# Patient Record
Sex: Female | Born: 1935 | Race: White | Hispanic: No | Marital: Married | Smoking: Never smoker
Health system: Southern US, Community
[De-identification: ages and names within clinical notes are randomized; demographics above are authoritative.]

## PROBLEM LIST (undated history)

## (undated) DIAGNOSIS — F32A Depression, unspecified: Secondary | ICD-10-CM

## (undated) DIAGNOSIS — F419 Anxiety disorder, unspecified: Secondary | ICD-10-CM

## (undated) DIAGNOSIS — F0390 Unspecified dementia without behavioral disturbance: Secondary | ICD-10-CM

## (undated) DIAGNOSIS — F329 Major depressive disorder, single episode, unspecified: Secondary | ICD-10-CM

## (undated) HISTORY — PX: FRACTURE SURGERY: SHX138

---

## 1998-10-22 ENCOUNTER — Ambulatory Visit (HOSPITAL_COMMUNITY): Admission: RE | Admit: 1998-10-22 | Discharge: 1998-10-22 | Payer: Self-pay | Admitting: Specialist

## 2000-03-02 ENCOUNTER — Other Ambulatory Visit: Admission: RE | Admit: 2000-03-02 | Discharge: 2000-03-02 | Payer: Self-pay | Admitting: Gastroenterology

## 2000-03-30 ENCOUNTER — Encounter: Admission: RE | Admit: 2000-03-30 | Discharge: 2000-03-30 | Payer: Self-pay

## 2000-03-30 ENCOUNTER — Encounter: Payer: Self-pay | Admitting: Gastroenterology

## 2000-11-02 ENCOUNTER — Inpatient Hospital Stay (HOSPITAL_COMMUNITY): Admission: EM | Admit: 2000-11-02 | Discharge: 2000-11-04 | Payer: Self-pay | Admitting: *Deleted

## 2001-03-29 ENCOUNTER — Other Ambulatory Visit: Admission: RE | Admit: 2001-03-29 | Discharge: 2001-03-29 | Payer: Self-pay | Admitting: Gastroenterology

## 2001-04-05 ENCOUNTER — Encounter: Admission: RE | Admit: 2001-04-05 | Discharge: 2001-04-05 | Payer: Self-pay | Admitting: Gastroenterology

## 2001-04-05 ENCOUNTER — Encounter: Payer: Self-pay | Admitting: Gastroenterology

## 2001-04-12 ENCOUNTER — Encounter: Admission: RE | Admit: 2001-04-12 | Discharge: 2001-04-12 | Payer: Self-pay | Admitting: Gastroenterology

## 2001-04-12 ENCOUNTER — Encounter: Payer: Self-pay | Admitting: Gastroenterology

## 2001-05-31 ENCOUNTER — Ambulatory Visit (HOSPITAL_COMMUNITY): Admission: RE | Admit: 2001-05-31 | Discharge: 2001-05-31 | Payer: Self-pay | Admitting: Specialist

## 2002-04-06 ENCOUNTER — Other Ambulatory Visit: Admission: RE | Admit: 2002-04-06 | Discharge: 2002-04-06 | Payer: Self-pay | Admitting: Gastroenterology

## 2002-04-26 ENCOUNTER — Encounter: Admission: RE | Admit: 2002-04-26 | Discharge: 2002-04-26 | Payer: Self-pay | Admitting: Gastroenterology

## 2002-04-26 ENCOUNTER — Encounter: Payer: Self-pay | Admitting: Gastroenterology

## 2003-04-12 ENCOUNTER — Other Ambulatory Visit: Admission: RE | Admit: 2003-04-12 | Discharge: 2003-04-12 | Payer: Self-pay | Admitting: Gastroenterology

## 2003-04-28 ENCOUNTER — Encounter: Payer: Self-pay | Admitting: Gastroenterology

## 2003-04-28 ENCOUNTER — Encounter: Admission: RE | Admit: 2003-04-28 | Discharge: 2003-04-28 | Payer: Self-pay | Admitting: Gastroenterology

## 2003-08-08 ENCOUNTER — Ambulatory Visit (HOSPITAL_COMMUNITY): Admission: RE | Admit: 2003-08-08 | Discharge: 2003-08-08 | Payer: Self-pay | Admitting: Gastroenterology

## 2003-08-08 ENCOUNTER — Encounter (INDEPENDENT_AMBULATORY_CARE_PROVIDER_SITE_OTHER): Payer: Self-pay | Admitting: *Deleted

## 2004-04-29 ENCOUNTER — Encounter: Admission: RE | Admit: 2004-04-29 | Discharge: 2004-04-29 | Payer: Self-pay | Admitting: Gastroenterology

## 2005-05-07 ENCOUNTER — Other Ambulatory Visit: Admission: RE | Admit: 2005-05-07 | Discharge: 2005-05-07 | Payer: Self-pay | Admitting: Gastroenterology

## 2005-05-19 ENCOUNTER — Encounter: Admission: RE | Admit: 2005-05-19 | Discharge: 2005-05-19 | Payer: Self-pay | Admitting: Gastroenterology

## 2005-10-29 ENCOUNTER — Encounter: Admission: RE | Admit: 2005-10-29 | Discharge: 2005-10-29 | Payer: Self-pay | Admitting: Gastroenterology

## 2006-05-21 ENCOUNTER — Encounter: Admission: RE | Admit: 2006-05-21 | Discharge: 2006-05-21 | Payer: Self-pay | Admitting: Gastroenterology

## 2006-06-30 ENCOUNTER — Emergency Department (HOSPITAL_COMMUNITY): Admission: EM | Admit: 2006-06-30 | Discharge: 2006-06-30 | Payer: Self-pay | Admitting: Family Medicine

## 2006-07-10 ENCOUNTER — Emergency Department (HOSPITAL_COMMUNITY): Admission: EM | Admit: 2006-07-10 | Discharge: 2006-07-10 | Payer: Self-pay | Admitting: Family Medicine

## 2007-05-17 ENCOUNTER — Other Ambulatory Visit: Admission: RE | Admit: 2007-05-17 | Discharge: 2007-05-17 | Payer: Self-pay | Admitting: Gastroenterology

## 2007-05-24 ENCOUNTER — Encounter: Admission: RE | Admit: 2007-05-24 | Discharge: 2007-05-24 | Payer: Self-pay | Admitting: Gastroenterology

## 2008-05-29 ENCOUNTER — Encounter: Admission: RE | Admit: 2008-05-29 | Discharge: 2008-05-29 | Payer: Self-pay | Admitting: Gastroenterology

## 2008-08-18 ENCOUNTER — Emergency Department (HOSPITAL_COMMUNITY): Admission: EM | Admit: 2008-08-18 | Discharge: 2008-08-18 | Payer: Self-pay | Admitting: Family Medicine

## 2009-06-04 ENCOUNTER — Encounter: Admission: RE | Admit: 2009-06-04 | Discharge: 2009-06-04 | Payer: Self-pay | Admitting: Gastroenterology

## 2009-08-25 ENCOUNTER — Encounter: Admission: RE | Admit: 2009-08-25 | Discharge: 2009-08-25 | Payer: Self-pay | Admitting: Neurology

## 2010-10-21 ENCOUNTER — Encounter: Admission: RE | Admit: 2010-10-21 | Discharge: 2010-10-21 | Payer: Self-pay | Admitting: Internal Medicine

## 2011-04-11 NOTE — Discharge Summary (Signed)
[place]  Patient:    Vickie Hoffman, Vickie Hoffman                    MRN: [id number] Adm. Date:  [id number] Disc. Date: 11/04/00 Attending:  Trisha Mangle                           Discharge Summary  PRINCIPAL DIAGNOSIS:  Hemorrhagic cystitis.  OTHER DIAGNOSIS:  Diabetes.  MAJOR OPERATION:  None.  CONDITION:  Patient is discharged home in stable, satisfactory and improved condition with clear urine and voiding without difficulty.  FOLLOWUP:  Her followup will be in my office in one week.  DISCHARGE MEDICATIONS:  Her medications will be unchanged from admission and she will continue on Levaquin until final culture results are obtained in my office later today and she will be contacted with these.  DIET:  Unchanged.  ACTIVITY:  Light, as tolerated.  BRIEF HISTORY:  Patient is a 77 year old white female who was seen in my office earlier the day of her admission with irritative voiding symptoms and some mild pyuria but no hematuria.  She was treated with intravesical DMSO, Solu-Cortef and bicarbonate and her urine was cultured.  She was started on empiric Levaquin but developed gross hematuria with clots and was admitted. Her full history and physical was dictated previously.  HOSPITAL COURSE:  Patient was admitted and Ainsworth catheter was placed in the bladder and a large volume of clots was evacuated from the bladder.  She was then started on intravenous fluid hydration and irrigation p.r.n. Laboratory results revealed mildly depressed sodium at 130.  Remainder of her electrolytes looked good with a normal creatinine.  Her coagulations were normal and she had a normal white count with an hemoglobin of 10 and hematocrit of 30.  Her hospital course consisted of the requirement of several irrigations throughout the 24 hours after admission but after that, her urine cleared completely and at the time of her discharge, her urine is entirely  clear and free of any clots or evidence of hematuria.  Her catheter has been removed and she will be discharged home on Levaquin, with followup and disposition as above. DD:  11/04/00 TD:  11/04/00 Job: [id number]

## 2011-04-11 NOTE — H&P (Signed)
[place]  Patient:    Vickie Hoffman, Vickie Hoffman                    MRN: [id number] Adm. Date:  [id number] Disc. Date: [id number] Attending:  Trisha Mangle CC:         Mark C. Vernie Ammons, M.D.   History and Physical  CHIEF COMPLAINT: Hematuria, bladder pain.  HISTORY OF PRESENT ILLNESS: This patient is a 77 year old white female with interstitial cystitis, who had a DMSO treatment in our office today.  She began to have hematuria and retention post treatment and is now in acute distress with pain and inability to void.  ALLERGIES:  1. SULFA.  2. CODEINE.  CURRENT MEDICATIONS:  1. Zocor.  2. Paxil.  3. Wellbutrin.  4. carbamazepine.  5. Baby aspirin.  6. Lorazepam.  7. She had a Levaquin tablet today in our office.  PAST MEDICAL HISTORY:  1. Interstitial cystitis.  2. Depression.  PAST SURGICAL HISTORY:  1. Cataract extraction.  2. Back surgery in 1972.  3. Tonsillectomy.  4. Three vaginal deliveries.  SOCIAL HISTORY: Negative for tobacco or alcohol use.  FAMILY HISTORY: Unremarkable.  REVIEW OF SYSTEMS: She complains of bladder pain and incontinence.  PHYSICAL EXAMINATION:  VITAL SIGNS: Blood pressure on admission is 220/108, heart rate 135, respirations 24, temperature 96.5 degrees.  GENERAL: She is an elderly, well-developed, well-nourished white female in moderate distress, alert and oriented x 3.  HEENT: Head normocephalic, atraumatic.  CHEST: Lungs clear with normal effort.  HEART: Tachycardic.  ABDOMEN: Soft, flat, with suprapubic mass and tenderness.  GU: Palpably full bladder.  Blood noted at the urethral meatus.  IMPRESSION:  1. Clot retention.  2. Interstitial cystitis.  3. Depression.  PLAN: In the ER initially a 66 French catheter had been placed and some clots had been evacuated but she did not get complete relief.  I then attempted to use a hematuria catheter; however, once again I was unable to get  relief.  I then used an Oleh Genin 24 [place] catheter and was able to successfully evacuate clots from her bladder.  She had considerable pain during the procedure associated with bladder spasms and required morphine and Levsin for pain.  In light of the hematuria and need for pain control she is to be admitted to the service of Dr. Vernie Ammons.  We will irrigate the catheter as needed. DD:  11/02/00 TD:  11/03/00 Job: [id number]

## 2011-10-02 ENCOUNTER — Encounter: Payer: Self-pay | Admitting: *Deleted

## 2011-10-02 ENCOUNTER — Inpatient Hospital Stay (HOSPITAL_COMMUNITY)
Admission: EM | Admit: 2011-10-02 | Discharge: 2011-10-09 | DRG: 481 | Disposition: A | Discharge status: Skilled Nursing Facility | Payer: Medicare Other | Source: Ambulatory Visit | Attending: Orthopaedic Surgery | Admitting: Orthopaedic Surgery

## 2011-10-02 ENCOUNTER — Emergency Department (HOSPITAL_COMMUNITY): Payer: Medicare Other

## 2011-10-02 DIAGNOSIS — W19XXXA Unspecified fall, initial encounter: Secondary | ICD-10-CM | POA: Diagnosis present

## 2011-10-02 DIAGNOSIS — R404 Transient alteration of awareness: Secondary | ICD-10-CM | POA: Diagnosis not present

## 2011-10-02 DIAGNOSIS — Y92009 Unspecified place in unspecified non-institutional (private) residence as the place of occurrence of the external cause: Secondary | ICD-10-CM

## 2011-10-02 DIAGNOSIS — D649 Anemia, unspecified: Secondary | ICD-10-CM

## 2011-10-02 DIAGNOSIS — D62 Acute posthemorrhagic anemia: Secondary | ICD-10-CM | POA: Diagnosis not present

## 2011-10-02 DIAGNOSIS — N189 Chronic kidney disease, unspecified: Secondary | ICD-10-CM | POA: Diagnosis present

## 2011-10-02 DIAGNOSIS — Z79899 Other long term (current) drug therapy: Secondary | ICD-10-CM

## 2011-10-02 DIAGNOSIS — I498 Other specified cardiac arrhythmias: Secondary | ICD-10-CM | POA: Diagnosis not present

## 2011-10-02 DIAGNOSIS — I129 Hypertensive chronic kidney disease with stage 1 through stage 4 chronic kidney disease, or unspecified chronic kidney disease: Secondary | ICD-10-CM | POA: Diagnosis present

## 2011-10-02 DIAGNOSIS — S72141A Displaced intertrochanteric fracture of right femur, initial encounter for closed fracture: Secondary | ICD-10-CM

## 2011-10-02 DIAGNOSIS — R000 Tachycardia, unspecified: Secondary | ICD-10-CM

## 2011-10-02 DIAGNOSIS — I10 Essential (primary) hypertension: Secondary | ICD-10-CM | POA: Diagnosis present

## 2011-10-02 DIAGNOSIS — Y9210 Unspecified residential institution as the place of occurrence of the external cause: Secondary | ICD-10-CM | POA: Diagnosis not present

## 2011-10-02 DIAGNOSIS — N179 Acute kidney failure, unspecified: Secondary | ICD-10-CM

## 2011-10-02 DIAGNOSIS — Z23 Encounter for immunization: Secondary | ICD-10-CM

## 2011-10-02 DIAGNOSIS — S7291XA Unspecified fracture of right femur, initial encounter for closed fracture: Secondary | ICD-10-CM

## 2011-10-02 DIAGNOSIS — S72143A Displaced intertrochanteric fracture of unspecified femur, initial encounter for closed fracture: Principal | ICD-10-CM | POA: Diagnosis present

## 2011-10-02 DIAGNOSIS — F0150 Vascular dementia without behavioral disturbance: Secondary | ICD-10-CM | POA: Diagnosis present

## 2011-10-02 DIAGNOSIS — F0390 Unspecified dementia without behavioral disturbance: Secondary | ICD-10-CM

## 2011-10-02 DIAGNOSIS — T40605A Adverse effect of unspecified narcotics, initial encounter: Secondary | ICD-10-CM | POA: Diagnosis not present

## 2011-10-02 HISTORY — DX: Unspecified dementia, unspecified severity, without behavioral disturbance, psychotic disturbance, mood disturbance, and anxiety: F03.90

## 2011-10-02 LAB — CBC
Hemoglobin: 11.8 g/dL — ABNORMAL LOW (ref 12.0–15.0)
MCH: 28.7 pg (ref 26.0–34.0)
Platelets: 209 10*3/uL (ref 150–400)
RDW: 14.1 % (ref 11.5–15.5)

## 2011-10-02 LAB — COMPREHENSIVE METABOLIC PANEL
BUN: 27 mg/dL — ABNORMAL HIGH (ref 6–23)
CO2: 30 mEq/L (ref 19–32)
Calcium: 9.5 mg/dL (ref 8.4–10.5)
Glucose, Bld: 104 mg/dL — ABNORMAL HIGH (ref 70–99)
Potassium: 3.6 mEq/L (ref 3.5–5.1)
Sodium: 138 mEq/L (ref 135–145)
Total Bilirubin: 0.3 mg/dL (ref 0.3–1.2)

## 2011-10-02 LAB — DIFFERENTIAL
Basophils Absolute: 0 10*3/uL (ref 0.0–0.1)
Lymphocytes Relative: 11 % — ABNORMAL LOW (ref 12–46)
Lymphs Abs: 1.1 10*3/uL (ref 0.7–4.0)
Monocytes Absolute: 0.6 10*3/uL (ref 0.1–1.0)
Neutrophils Relative %: 82 % — ABNORMAL HIGH (ref 43–77)

## 2011-10-02 LAB — URINALYSIS, ROUTINE W REFLEX MICROSCOPIC
Glucose, UA: NEGATIVE mg/dL
Hgb urine dipstick: NEGATIVE
Nitrite: NEGATIVE
pH: 6.5 (ref 5.0–8.0)

## 2011-10-02 IMAGING — CR DG HIP COMPLETE 2+V*R*
3 series · 3 of 3 positions shown · non-contrast
Comparison: None.

CLINICAL DATA: Fall

RIGHT HIP - COMPLETE 2+ VIEW

[t pelvis a.p.]
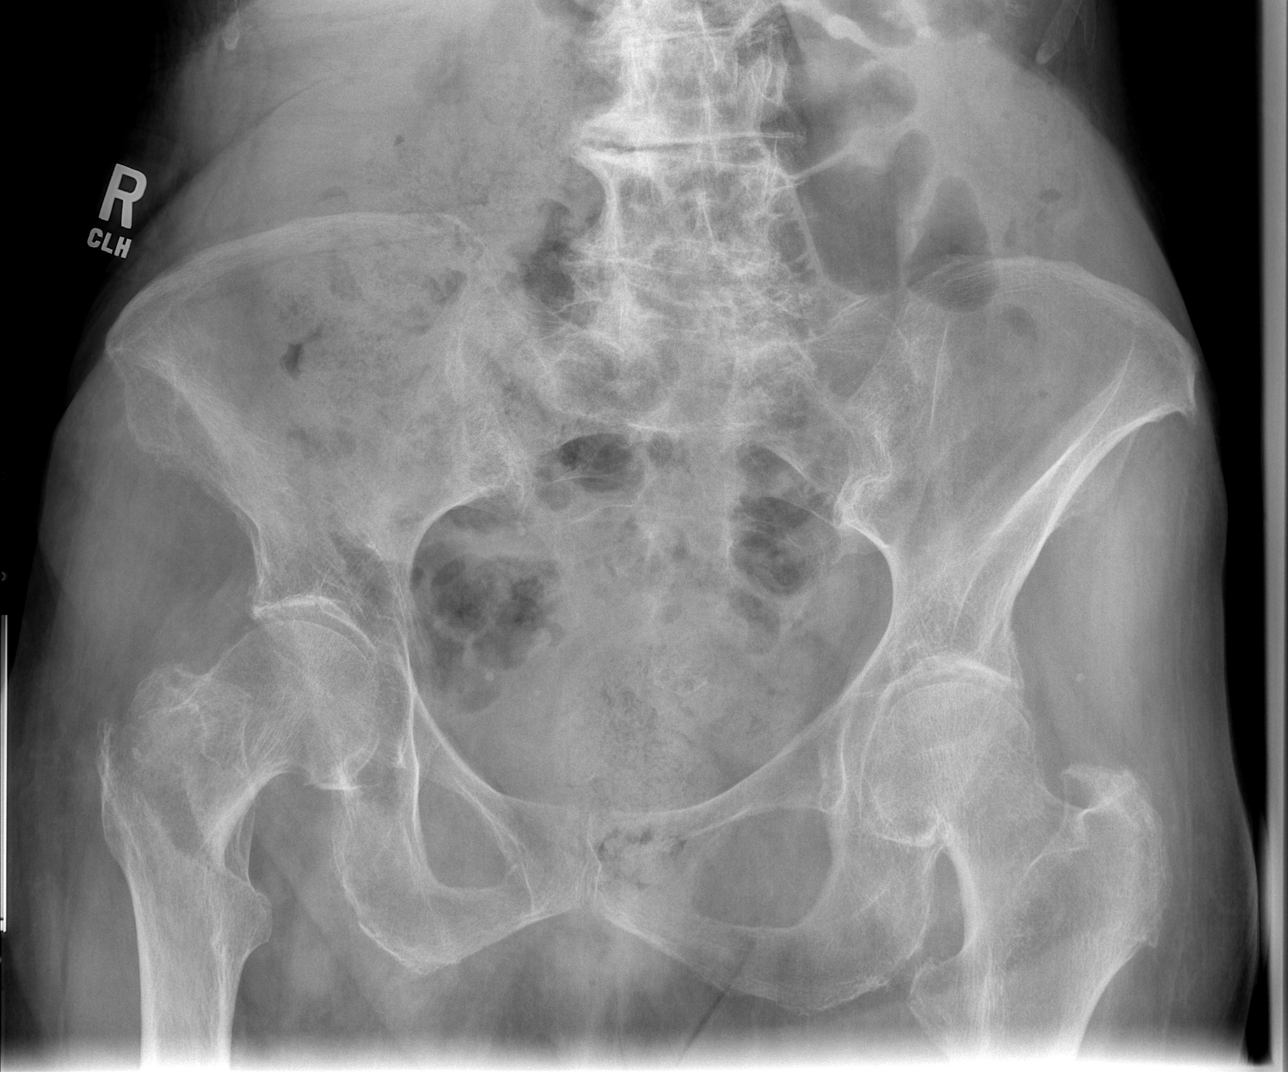

[t hip ap right]
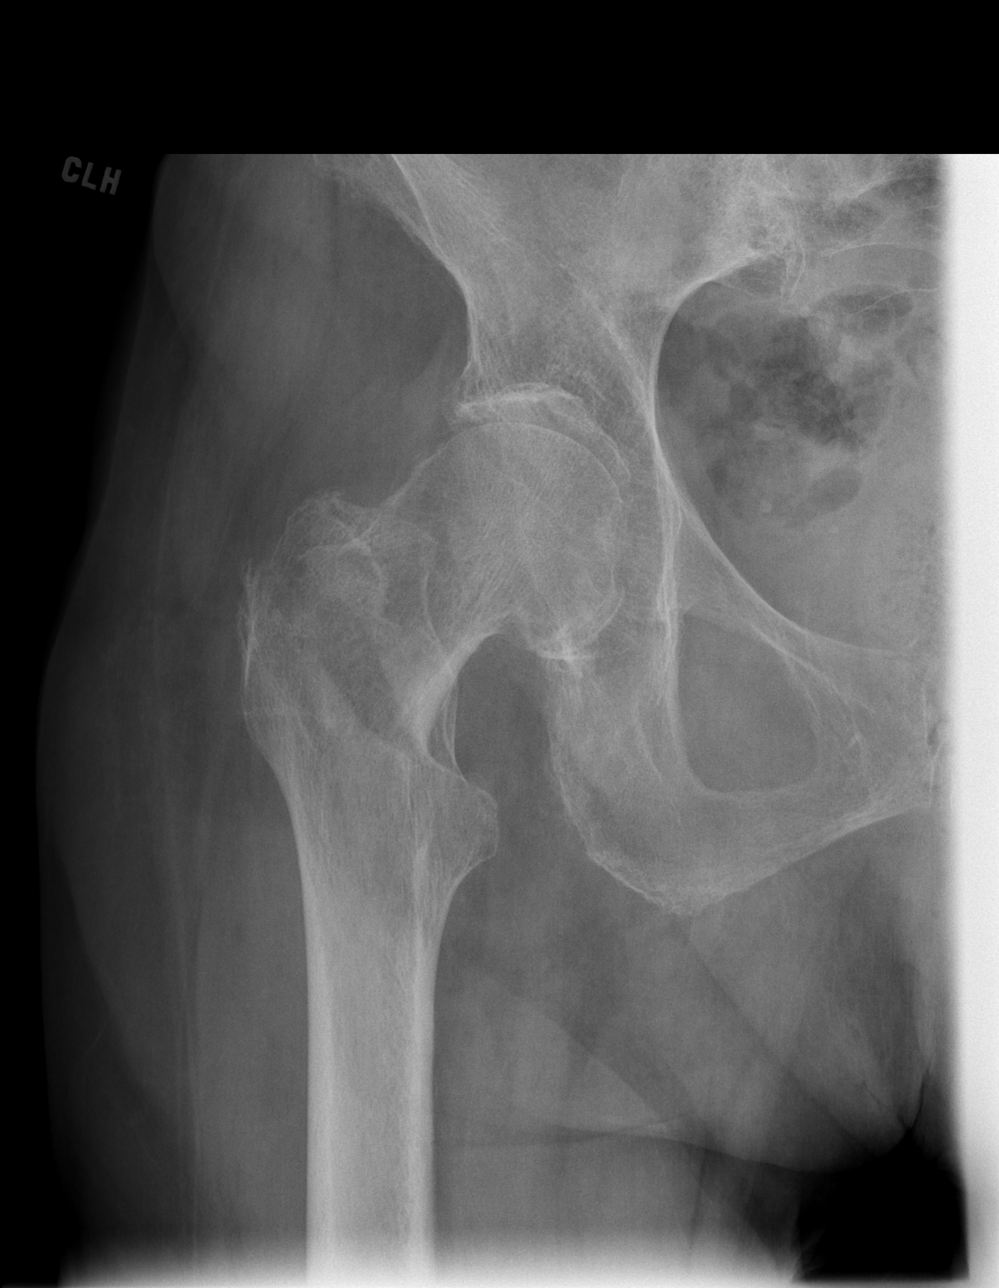

[w cross table hip right *]
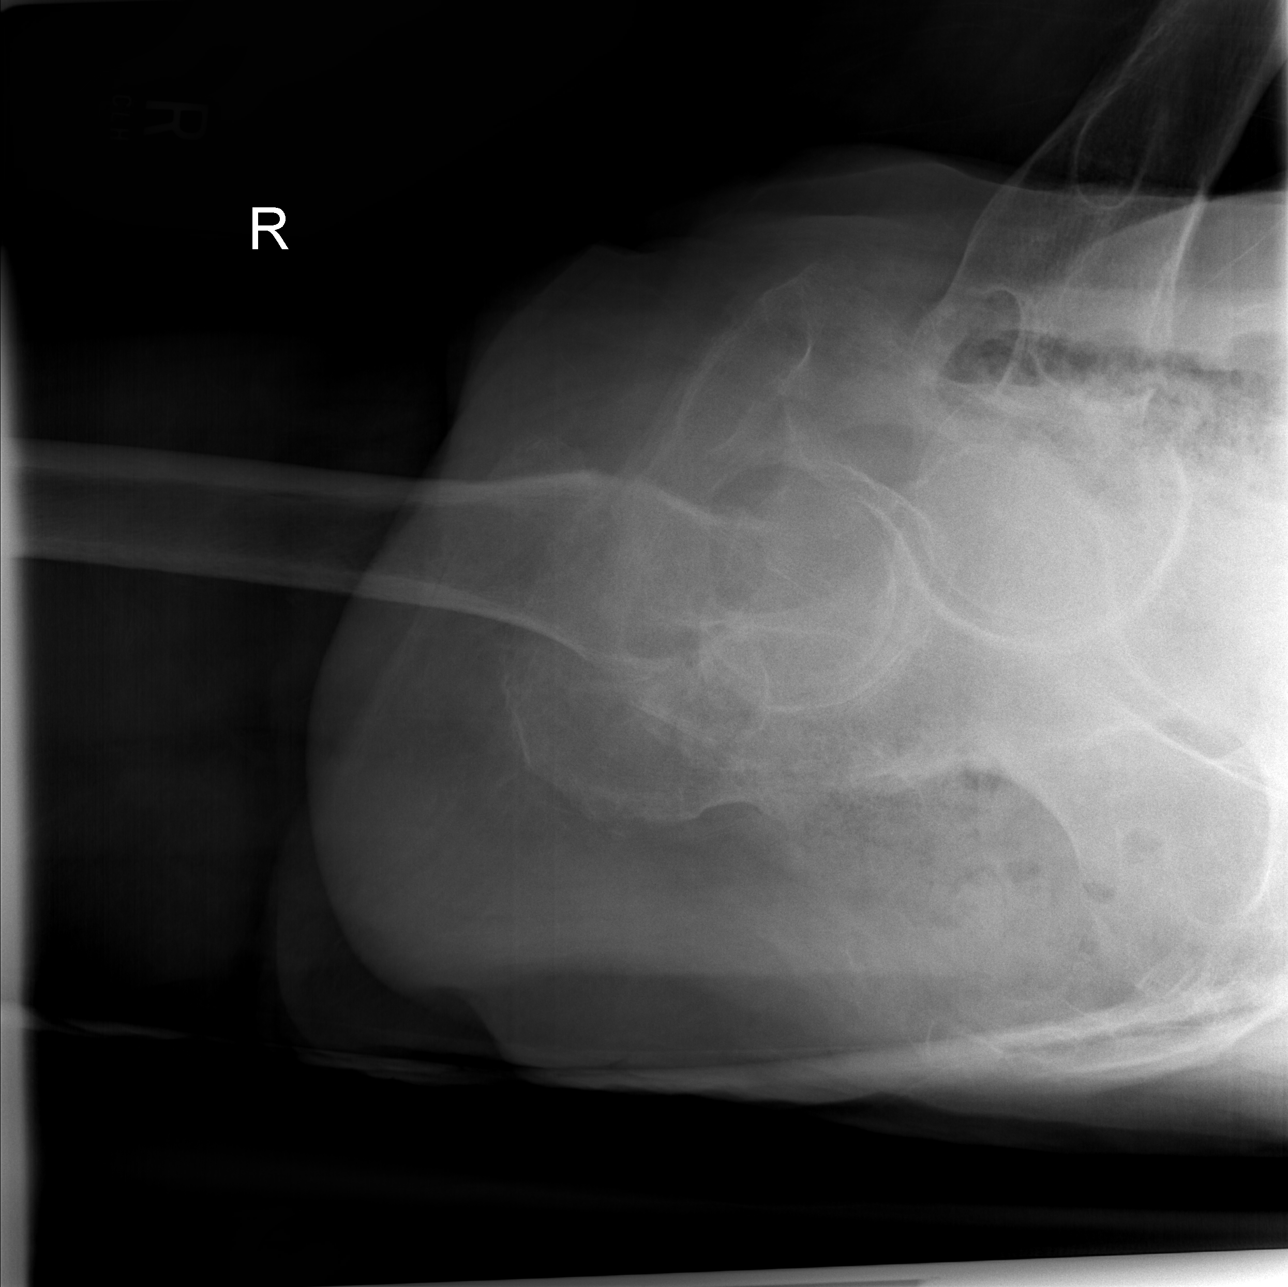

[3 of 3 positions shown; findings below may reference images not displayed]

FINDINGS: Intertrochanteric right femur fracture is present with
varus deformity.  Osteopenia.  Degenerative changes in the spine.
IMPRESSION: Intertrochanteric right femur fracture. Per CMS PQRS reporting
requirements (PQRS Measure 24): Given the patient's age of greater
than 50 and the fracture site (hip, distal radius, or spine), the
patient should be tested for osteoporosis using DXA, and the
appropriate treatment considered based on the DXA results.

## 2011-10-02 MED ORDER — HYDROCODONE-ACETAMINOPHEN 5-325 MG PO TABS
2.0000 | ORAL_TABLET | Freq: Once | ORAL | Status: AC
  Administered 2011-10-02: 2 via ORAL
  Filled 2011-10-02: qty 2

## 2011-10-02 NOTE — ED Notes (Signed)
Pt had episode of urinary incontinence. Pt cleaned and dried. Pt then goes to radiology for x-rays.

## 2011-10-02 NOTE — ED Notes (Signed)
Pt lost balance and fell at home. Pt c/o right leg pain. Pt with dementia, falls frequently. Lives at home with spouse. Denies LOC.

## 2011-10-02 NOTE — ED Provider Notes (Signed)
History     CSN: [id number] Arrival date & time: 10/02/2011 10:07 PM   First MD Initiated Contact with Patient 10/02/11 2210      Chief Complaint  Patient presents with  . Fall   Level V caveat because of dementia (Consider location/radiation/quality/duration/timing/severity/associated sxs/prior treatment) HPI  Family relates patient falls a lot. She they state tonight she was in the kitchen and turned losing her balance and fell backwards and when they found her she was lying on her right hip. She denies any headache or neck pain.  Primary care Dr. Eula Listen  Past Medical History  Diagnosis Date  . Dementia     No past surgical history on file.  No family history on file.  History  Substance Use Topics  . Smoking status: Never Smoker   . Smokeless tobacco: Not on file  . Alcohol Use: No   lives at home with spouse  OB History    Grav Para Term Preterm Abortions TAB SAB Ect Mult Living                  Review of Systems  Unable to perform ROS   Allergies  Review of patient's allergies indicates no known allergies.  Home Medications   Current Outpatient Rx  Name Route Sig Dispense Refill  . ASPIRIN 81 MG PO CHEW Oral Chew 81 mg by mouth daily.      . BUPROPION HCL ER (SR) 200 MG PO TB12 Oral Take 200 mg by mouth 2 (two) times daily.      . DONEPEZIL HCL 10 MG PO TABS Oral Take 10 mg by mouth at bedtime.      Marland Kitchen LORAZEPAM 0.5 MG PO TABS Oral Take 1 mg by mouth at bedtime.      . MULTIVITAMINS PO TABS Oral Take 1 tablet by mouth daily.      . OXYBUTYNIN CHLORIDE 5 MG PO TABS Oral Take 5 mg by mouth 2 (two) times daily.      . VENLAFAXINE HCL 75 MG PO CP24 Oral Take 75 mg by mouth daily.        BP 174/84  Pulse 87  Temp(Src) 98.3 F (36.8 C) (Oral)  Resp 14  Ht 5\' 8"  (1.727 m)  SpO2 94%  Vital signs show hypertension  Physical Exam  Vitals reviewed. Constitutional: She appears well-developed and well-nourished.  HENT:  Head: Normocephalic and  atraumatic.  Mouth/Throat: Uvula is midline, oropharynx is clear and moist and mucous membranes are normal.       Patient has no tenderness to palpation of her head. There's no bruising or swelling seen of her face or head.  Eyes: Conjunctivae and EOM are normal. Pupils are equal, round, and reactive to light.  Neck: Normal range of motion. Neck supple.  Cardiovascular: Normal rate, regular rhythm and normal heart sounds.   Pulmonary/Chest: Effort normal and breath sounds normal. No respiratory distress. She has no wheezes. She has no rales. She exhibits no tenderness.  Abdominal: Soft. There is no tenderness.  Musculoskeletal:       Patient appears to have some shortening of her right leg and she has some minor internal rotation. She relates when she tries to flex her knees she gets pain in her back bilaterally. She does have some tenderness to palpation of the lower lumbar spine. She does not have any pain to palpation of her knees lower leg or ankle or foot.  Neurological: She is alert.  Patient is alert and cooperative however she's not oriented  Skin: Skin is warm and dry. No rash noted. No pallor.       No bruising seen  Psychiatric: She has a normal mood and affect. Her behavior is normal.    ED Course  Procedures (including critical care time)  22:40 Pt refused pain medication.  22:58 family request pain medicine for the patient. Pt started on IV fluids, IV pain and nausea meds, foley catheter 12:52 Dr Magnus Ivan will admit have medicine consult, NPO 01:06 Dr Gerrit Heck will consult on patient.   Results for orders placed during the hospital encounter of 10/02/11  CBC      Component Value Range   WBC 10.1  4.0 - 10.5 (K/uL)   RBC 4.11  3.87 - 5.11 (MIL/uL)   Hemoglobin 11.8 (*) 12.0 - 15.0 (g/dL)   HCT 16.1  09.6 - 04.5 (%)   MCV 87.6  78.0 - 100.0 (fL)   MCH 28.7  26.0 - 34.0 (pg)   MCHC 32.8  30.0 - 36.0 (g/dL)   RDW 40.9  81.1 - 91.4 (%)   Platelets 209  150 - 400  (K/uL)  DIFFERENTIAL      Component Value Range   Neutrophils Relative 82 (*) 43 - 77 (%)   Neutro Abs 8.3 (*) 1.7 - 7.7 (K/uL)   Lymphocytes Relative 11 (*) 12 - 46 (%)   Lymphs Abs 1.1  0.7 - 4.0 (K/uL)   Monocytes Relative 6  3 - 12 (%)   Monocytes Absolute 0.6  0.1 - 1.0 (K/uL)   Eosinophils Relative 1  0 - 5 (%)   Eosinophils Absolute 0.1  0.0 - 0.7 (K/uL)   Basophils Relative 0  0 - 1 (%)   Basophils Absolute 0.0  0.0 - 0.1 (K/uL)  COMPREHENSIVE METABOLIC PANEL      Component Value Range   Sodium 138  135 - 145 (mEq/L)   Potassium 3.6  3.5 - 5.1 (mEq/L)   Chloride 99  96 - 112 (mEq/L)   CO2 30  19 - 32 (mEq/L)   Glucose, Bld 104 (*) 70 - 99 (mg/dL)   BUN 27 (*) 6 - 23 (mg/dL)   Creatinine, Ser 7.82 (*) 0.50 - 1.10 (mg/dL)   Calcium 9.5  8.4 - 95.6 (mg/dL)   Total Protein 6.7  6.0 - 8.3 (g/dL)   Albumin 3.4 (*) 3.5 - 5.2 (g/dL)   AST 15  0 - 37 (U/L)   ALT 15  0 - 35 (U/L)   Alkaline Phosphatase 120 (*) 39 - 117 (U/L)   Total Bilirubin 0.3  0.3 - 1.2 (mg/dL)   GFR calc non Af Amer 40 (*) >90 (mL/min)   GFR calc Af Amer 47 (*) >90 (mL/min)  URINALYSIS, ROUTINE W REFLEX MICROSCOPIC      Component Value Range   Color, Urine YELLOW  YELLOW    Appearance CLEAR  CLEAR    Specific Gravity, Urine 1.019  1.005 - 1.030    pH 6.5  5.0 - 8.0    Glucose, UA NEGATIVE  NEGATIVE (mg/dL)   Hgb urine dipstick NEGATIVE  NEGATIVE    Bilirubin Urine NEGATIVE  NEGATIVE    Ketones, ur 15 (*) NEGATIVE (mg/dL)   Protein, ur NEGATIVE  NEGATIVE (mg/dL)   Urobilinogen, UA 0.2  0.0 - 1.0 (mg/dL)   Nitrite NEGATIVE  NEGATIVE    Leukocytes, UA NEGATIVE  NEGATIVE      Dg Lumbar Spine Complete  10/03/2011  *  RADIOLOGY REPORT*  Clinical Data: Fall  LUMBAR SPINE - COMPLETE 4+ VIEW  Comparison: None.  Findings: Anatomic alignment on the lateral view.  Mild levoscoliosis with the apex at L3-4.  No vertebral body height loss.  Severe multilevel this space narrowing and osteophyte formation.   Osteopenia.  IMPRESSION: No acute bony pathology.  Chronic change.  Original Report Authenticated By: Donavan Burnet, M.D.   Dg Hip Complete Right  10/03/2011  *RADIOLOGY REPORT*  Clinical Data: Fall  RIGHT HIP - COMPLETE 2+ VIEW  Comparison: None.  Findings: Intertrochanteric right femur fracture is present with varus deformity.  Osteopenia.  Degenerative changes in the spine.  IMPRESSION: Intertrochanteric right femur fracture. Per CMS PQRS reporting requirements (PQRS Measure 24): Given the patient's age of greater than 50 and the fracture site (hip, distal radius, or spine), the patient should be tested for osteoporosis using DXA, and the appropriate treatment considered based on the DXA results.  Original Report Authenticated By: Donavan Burnet, M.D.      1. Dementia   2. Intertrochanteric fracture of right femur    Plan admit   MDM          Ward Givens, MD 10/03/11 1610  Ward Givens, MD 10/03/11 1035

## 2011-10-03 ENCOUNTER — Other Ambulatory Visit: Payer: Self-pay

## 2011-10-03 ENCOUNTER — Encounter (HOSPITAL_COMMUNITY)
Admission: EM | Disposition: A | Discharge status: Skilled Nursing Facility | Payer: Self-pay | Source: Ambulatory Visit | Attending: Orthopaedic Surgery

## 2011-10-03 ENCOUNTER — Inpatient Hospital Stay (HOSPITAL_COMMUNITY): Payer: Medicare Other | Admitting: Anesthesiology

## 2011-10-03 ENCOUNTER — Inpatient Hospital Stay (HOSPITAL_COMMUNITY): Payer: Medicare Other

## 2011-10-03 ENCOUNTER — Encounter (HOSPITAL_COMMUNITY): Payer: Self-pay | Admitting: Anesthesiology

## 2011-10-03 ENCOUNTER — Emergency Department (HOSPITAL_COMMUNITY): Payer: Medicare Other

## 2011-10-03 DIAGNOSIS — F0150 Vascular dementia without behavioral disturbance: Secondary | ICD-10-CM | POA: Diagnosis present

## 2011-10-03 DIAGNOSIS — I10 Essential (primary) hypertension: Secondary | ICD-10-CM | POA: Diagnosis present

## 2011-10-03 DIAGNOSIS — S7291XA Unspecified fracture of right femur, initial encounter for closed fracture: Secondary | ICD-10-CM | POA: Diagnosis present

## 2011-10-03 DIAGNOSIS — D649 Anemia, unspecified: Secondary | ICD-10-CM | POA: Clinically undetermined

## 2011-10-03 DIAGNOSIS — N179 Acute kidney failure, unspecified: Secondary | ICD-10-CM | POA: Diagnosis present

## 2011-10-03 HISTORY — PX: COMPRESSION HIP SCREW: SHX1386

## 2011-10-03 LAB — RETICULOCYTES
RBC.: 3.95 MIL/uL (ref 3.87–5.11)
Retic Count, Absolute: 43.5 10*3/uL (ref 19.0–186.0)

## 2011-10-03 LAB — IRON AND TIBC
Iron: 30 ug/dL — ABNORMAL LOW (ref 42–135)
Saturation Ratios: 13 % — ABNORMAL LOW (ref 20–55)
UIBC: 198 ug/dL (ref 125–400)

## 2011-10-03 LAB — PROTIME-INR: INR: 0.99 (ref 0.00–1.49)

## 2011-10-03 LAB — FOLATE: Folate: 16.5 ng/mL

## 2011-10-03 LAB — TSH: TSH: 0.712 u[IU]/mL (ref 0.350–4.500)

## 2011-10-03 LAB — APTT: aPTT: 28 seconds (ref 24–37)

## 2011-10-03 LAB — VITAMIN B12: Vitamin B-12: 1892 pg/mL — ABNORMAL HIGH (ref 211–911)

## 2011-10-03 LAB — CREATININE, URINE, RANDOM: Creatinine, Urine: 178.98 mg/dL

## 2011-10-03 SURGERY — COMPRESSION HIP
Anesthesia: General | Laterality: Right | Wound class: Clean

## 2011-10-03 MED ORDER — WARFARIN SODIUM 2.5 MG PO TABS
2.5000 mg | ORAL_TABLET | Freq: Once | ORAL | Status: AC
Start: 2011-10-03 — End: 2011-10-04
  Administered 2011-10-04: 2.5 mg via ORAL
  Filled 2011-10-03: qty 1

## 2011-10-03 MED ORDER — SODIUM CHLORIDE 0.9 % IV SOLN
10000.0000 ug | INTRAVENOUS | Status: DC | PRN
  Administered 2011-10-03: 25 ug/min via INTRAVENOUS

## 2011-10-03 MED ORDER — NEOSTIGMINE METHYLSULFATE 1 MG/ML IJ SOLN
INTRAMUSCULAR | Status: DC | PRN
  Administered 2011-10-03: 5 mg via INTRAVENOUS

## 2011-10-03 MED ORDER — SODIUM CHLORIDE 0.9 % IV SOLN
INTRAVENOUS | Status: DC
  Administered 2011-10-03: 14:00:00 via INTRAVENOUS

## 2011-10-03 MED ORDER — METHOCARBAMOL 500 MG PO TABS
500.0000 mg | ORAL_TABLET | Freq: Four times a day (QID) | ORAL | Status: DC | PRN
  Filled 2011-10-03: qty 1

## 2011-10-03 MED ORDER — THROMBIN 5000 UNITS EX KIT
PACK | CUTANEOUS | Status: DC | PRN
  Administered 2011-10-03: 19:00:00 via TOPICAL

## 2011-10-03 MED ORDER — OXYBUTYNIN CHLORIDE 5 MG PO TABS
5.0000 mg | ORAL_TABLET | Freq: Two times a day (BID) | ORAL | Status: DC
  Administered 2011-10-03 – 2011-10-09 (×11): 5 mg via ORAL
  Filled 2011-10-03 (×15): qty 1

## 2011-10-03 MED ORDER — GLYCOPYRROLATE 0.2 MG/ML IJ SOLN
INTRAMUSCULAR | Status: DC | PRN
  Administered 2011-10-03: .6 mg via INTRAVENOUS

## 2011-10-03 MED ORDER — FENTANYL CITRATE 0.05 MG/ML IJ SOLN
INTRAMUSCULAR | Status: DC | PRN
  Administered 2011-10-03 (×2): 100 ug via INTRAVENOUS

## 2011-10-03 MED ORDER — WARFARIN VIDEO
Freq: Once | Status: DC
  Filled 2011-10-03: qty 1

## 2011-10-03 MED ORDER — ASPIRIN 81 MG PO CHEW
81.0000 mg | CHEWABLE_TABLET | Freq: Every day | ORAL | Status: DC
  Administered 2011-10-03 – 2011-10-09 (×6): 81 mg via ORAL
  Filled 2011-10-03 (×8): qty 1

## 2011-10-03 MED ORDER — DEXAMETHASONE SODIUM PHOSPHATE 4 MG/ML IJ SOLN
INTRAMUSCULAR | Status: DC | PRN
  Administered 2011-10-03: 4 mg via INTRAVENOUS

## 2011-10-03 MED ORDER — AMLODIPINE BESYLATE 5 MG PO TABS
5.0000 mg | ORAL_TABLET | Freq: Every day | ORAL | Status: DC
  Administered 2011-10-03 – 2011-10-09 (×7): 5 mg via ORAL
  Filled 2011-10-03 (×10): qty 1

## 2011-10-03 MED ORDER — PATIENT'S GUIDE TO USING COUMADIN BOOK
Freq: Once | Status: AC
  Administered 2011-10-03: 23:00:00
  Filled 2011-10-03: qty 1

## 2011-10-03 MED ORDER — METHOCARBAMOL 100 MG/ML IJ SOLN
500.0000 mg | Freq: Four times a day (QID) | INTRAVENOUS | Status: DC | PRN
  Filled 2011-10-03: qty 5

## 2011-10-03 MED ORDER — MORPHINE SULFATE 4 MG/ML IJ SOLN
4.0000 mg | INTRAMUSCULAR | Status: DC | PRN
  Administered 2011-10-03 (×2): 4 mg via INTRAVENOUS
  Filled 2011-10-03 (×2): qty 1

## 2011-10-03 MED ORDER — VENLAFAXINE HCL ER 75 MG PO CP24
75.0000 mg | ORAL_CAPSULE | Freq: Every day | ORAL | Status: DC
  Administered 2011-10-03 – 2011-10-09 (×6): 75 mg via ORAL
  Filled 2011-10-03 (×8): qty 1

## 2011-10-03 MED ORDER — BUPROPION HCL ER (SR) 100 MG PO TB12
200.0000 mg | ORAL_TABLET | Freq: Two times a day (BID) | ORAL | Status: DC
  Administered 2011-10-03 – 2011-10-09 (×11): 200 mg via ORAL
  Filled 2011-10-03 (×16): qty 2

## 2011-10-03 MED ORDER — DROPERIDOL 2.5 MG/ML IJ SOLN
INTRAMUSCULAR | Status: DC | PRN
  Administered 2011-10-03: 0.625 mg via INTRAVENOUS

## 2011-10-03 MED ORDER — THERA M PLUS PO TABS
1.0000 | ORAL_TABLET | Freq: Every day | ORAL | Status: DC
  Administered 2011-10-03 – 2011-10-09 (×6): 1 via ORAL
  Filled 2011-10-03 (×7): qty 1

## 2011-10-03 MED ORDER — LACTATED RINGERS IV SOLN
INTRAVENOUS | Status: DC | PRN
  Administered 2011-10-03: 17:00:00 via INTRAVENOUS

## 2011-10-03 MED ORDER — SODIUM CHLORIDE 0.9 % IV SOLN: INTRAVENOUS | Status: DC

## 2011-10-03 MED ORDER — HETASTARCH-ELECTROLYTES 6 % IV SOLN
INTRAVENOUS | Status: DC | PRN
  Administered 2011-10-03: 18:00:00 via INTRAVENOUS

## 2011-10-03 MED ORDER — ETOMIDATE 2 MG/ML IV SOLN
INTRAVENOUS | Status: DC | PRN
  Administered 2011-10-03: 16 mg via INTRAVENOUS

## 2011-10-03 MED ORDER — DONEPEZIL HCL 10 MG PO TABS
10.0000 mg | ORAL_TABLET | Freq: Every day | ORAL | Status: DC
  Administered 2011-10-03 – 2011-10-08 (×5): 10 mg via ORAL
  Filled 2011-10-03 (×8): qty 1

## 2011-10-03 MED ORDER — CEFAZOLIN SODIUM 1-5 GM-% IV SOLN
INTRAVENOUS | Status: AC
  Filled 2011-10-03: qty 50

## 2011-10-03 MED ORDER — ONDANSETRON HCL 4 MG/2ML IJ SOLN
INTRAMUSCULAR | Status: DC | PRN
  Administered 2011-10-03: 4 mg via INTRAVENOUS

## 2011-10-03 MED ORDER — HYDROCODONE-ACETAMINOPHEN 5-325 MG PO TABS: 1.0000 | ORAL_TABLET | ORAL | Status: DC | PRN

## 2011-10-03 MED ORDER — LORAZEPAM 1 MG PO TABS
1.0000 mg | ORAL_TABLET | Freq: Every day | ORAL | Status: DC
  Administered 2011-10-03: 1 mg via ORAL
  Filled 2011-10-03: qty 1

## 2011-10-03 MED ORDER — ROCURONIUM BROMIDE 100 MG/10ML IV SOLN
INTRAVENOUS | Status: DC | PRN
  Administered 2011-10-03: 50 mg via INTRAVENOUS

## 2011-10-03 MED ORDER — ONDANSETRON HCL 4 MG/2ML IJ SOLN
4.0000 mg | Freq: Four times a day (QID) | INTRAMUSCULAR | Status: DC | PRN
  Administered 2011-10-03: 4 mg via INTRAVENOUS
  Filled 2011-10-03: qty 2

## 2011-10-03 SURGICAL SUPPLY — 44 items
BANDAGE GAUZE ELAST BULKY 4 IN (GAUZE/BANDAGES/DRESSINGS) ×2 IMPLANT
BLADE SURG 10 STRL SS (BLADE) ×2 IMPLANT
CLOTH BEACON ORANGE TIMEOUT ST (SAFETY) ×2 IMPLANT
COVER SURGICAL LIGHT HANDLE (MISCELLANEOUS) ×2 IMPLANT
DRAPE STERI IOBAN 125X83 (DRAPES) ×2 IMPLANT
DRSG MEPILEX BORDER 4X4 (GAUZE/BANDAGES/DRESSINGS) ×2 IMPLANT
DRSG MEPILEX BORDER 4X8 (GAUZE/BANDAGES/DRESSINGS) ×2 IMPLANT
DRSG PAD ABDOMINAL 8X10 ST (GAUZE/BANDAGES/DRESSINGS) ×2 IMPLANT
DURAPREP 26ML APPLICATOR (WOUND CARE) ×2 IMPLANT
ELECT CAUTERY BLADE 6.4 (BLADE) ×2 IMPLANT
ELECT REM PT RETURN 9FT ADLT (ELECTROSURGICAL) ×2
ELECTRODE REM PT RTRN 9FT ADLT (ELECTROSURGICAL) ×1 IMPLANT
GAUZE XEROFORM 1X8 LF (GAUZE/BANDAGES/DRESSINGS) ×2 IMPLANT
GLOVE BIOGEL PI IND STRL 8 (GLOVE) ×1 IMPLANT
GLOVE BIOGEL PI INDICATOR 8 (GLOVE) ×1
GLOVE ORTHO TXT STRL SZ7.5 (GLOVE) ×2 IMPLANT
GOWN PREVENTION PLUS LG XLONG (DISPOSABLE) IMPLANT
GOWN PREVENTION PLUS XLARGE (GOWN DISPOSABLE) ×2 IMPLANT
GOWN STRL NON-REIN LRG LVL3 (GOWN DISPOSABLE) ×6 IMPLANT
GUIDE PIN 3.2 LONG (PIN) ×2 IMPLANT
GUIDE PIN 3.2MM (MISCELLANEOUS) ×2
GUIDE PIN ORTH 343X3.2XBRAD (MISCELLANEOUS) IMPLANT
HIP SCREW SET (Screw) ×2 IMPLANT
KIT BASIN OR (CUSTOM PROCEDURE TRAY) ×2 IMPLANT
KIT ROOM TURNOVER OR (KITS) ×2 IMPLANT
MANIFOLD NEPTUNE II (INSTRUMENTS) ×2 IMPLANT
NAIL IMHS 10X36 R (Nail) ×1 IMPLANT
NEEDLE HYPO 25GX1X1/2 BEV (NEEDLE) ×2 IMPLANT
NS IRRIG 1000ML POUR BTL (IV SOLUTION) ×2 IMPLANT
PACK GENERAL/GYN (CUSTOM PROCEDURE TRAY) ×2 IMPLANT
PAD ARMBOARD 7.5X6 YLW CONV (MISCELLANEOUS) ×2 IMPLANT
SCREW COMPRESSION (Screw) ×2 IMPLANT
SCREW LAG 90MM (Screw) ×2 IMPLANT
SCREW LAGSTD 90X21X12.7X9 (Screw) ×1 IMPLANT
SPONGE SURGIFOAM ABS GEL SZ50 (HEMOSTASIS) ×2 IMPLANT
STAPLER VISISTAT 35W (STAPLE) ×2 IMPLANT
SUT VIC AB 0 CT1 27 (SUTURE) ×2
SUT VIC AB 0 CT1 27XBRD ANBCTR (SUTURE) ×1 IMPLANT
SUT VIC AB 2-0 CT1 27 (SUTURE) ×2
SUT VIC AB 2-0 CT1 TAPERPNT 27 (SUTURE) ×1 IMPLANT
SYR CONTROL 10ML LL (SYRINGE) ×2 IMPLANT
TOWEL OR 17X24 6PK STRL BLUE (TOWEL DISPOSABLE) ×2 IMPLANT
TOWEL OR 17X26 10 PK STRL BLUE (TOWEL DISPOSABLE) ×2 IMPLANT
WATER STERILE IRR 1000ML POUR (IV SOLUTION) ×2 IMPLANT

## 2011-10-03 NOTE — Consult Note (Signed)
Reason for Consult:medical management Referring Physician: Dr. Rayburn Ma, orthopedic services  Vickie Hoffman is an 77 y.o. female.  HPI: the pt is a very demented lady who is otherwise healthy and on psych meds only due to dementia who is here on account of a fall and a hip fx.  We were asked to eval and medically clear the pt for possible surgical intervention in the morning The pt cannot provide any history and al her family is in the room with her, she has no known medical problems  Past Medical History  Diagnosis Date  . Dementia     No past surgical history on file.  No family history on file.  Social History:  reports that she has never smoked. She does not have any smokeless tobacco history on file. She reports that she does not drink alcohol or use illicit drugs.  Allergies: No Known Allergies  Medications:   Results for orders placed during the hospital encounter of 10/02/11 (from the past 48 hour(s))  CBC     Status: Abnormal   Collection Time   10/02/11 10:55 PM      Component Value Range Comment   WBC 10.1  4.0 - 10.5 (K/uL)    RBC 4.11  3.87 - 5.11 (MIL/uL)    Hemoglobin 11.8 (*) 12.0 - 15.0 (g/dL)    HCT 13.0  86.5 - 78.4 (%)    MCV 87.6  78.0 - 100.0 (fL)    MCH 28.7  26.0 - 34.0 (pg)    MCHC 32.8  30.0 - 36.0 (g/dL)    RDW 69.6  29.5 - 28.4 (%)    Platelets 209  150 - 400 (K/uL)   DIFFERENTIAL     Status: Abnormal   Collection Time   10/02/11 10:55 PM      Component Value Range Comment   Neutrophils Relative 82 (*) 43 - 77 (%)    Neutro Abs 8.3 (*) 1.7 - 7.7 (K/uL)    Lymphocytes Relative 11 (*) 12 - 46 (%)    Lymphs Abs 1.1  0.7 - 4.0 (K/uL)    Monocytes Relative 6  3 - 12 (%)    Monocytes Absolute 0.6  0.1 - 1.0 (K/uL)    Eosinophils Relative 1  0 - 5 (%)    Eosinophils Absolute 0.1  0.0 - 0.7 (K/uL)    Basophils Relative 0  0 - 1 (%)    Basophils Absolute 0.0  0.0 - 0.1 (K/uL)   COMPREHENSIVE METABOLIC PANEL     Status: Abnormal   Collection Time    10/02/11 10:55 PM      Component Value Range Comment   Sodium 138  135 - 145 (mEq/L)    Potassium 3.6  3.5 - 5.1 (mEq/L)    Chloride 99  96 - 112 (mEq/L)    CO2 30  19 - 32 (mEq/L)    Glucose, Bld 104 (*) 70 - 99 (mg/dL)    BUN 27 (*) 6 - 23 (mg/dL)    Creatinine, Ser 1.32 (*) 0.50 - 1.10 (mg/dL)    Calcium 9.5  8.4 - 10.5 (mg/dL)    Total Protein 6.7  6.0 - 8.3 (g/dL)    Albumin 3.4 (*) 3.5 - 5.2 (g/dL)    AST 15  0 - 37 (U/L)    ALT 15  0 - 35 (U/L)    Alkaline Phosphatase 120 (*) 39 - 117 (U/L)    Total Bilirubin 0.3  0.3 - 1.2 (mg/dL)  GFR calc non Af Amer 40 (*) >90 (mL/min)    GFR calc Af Amer 47 (*) >90 (mL/min)   URINALYSIS, ROUTINE W REFLEX MICROSCOPIC     Status: Abnormal   Collection Time   10/02/11 11:36 PM      Component Value Range Comment   Color, Urine YELLOW  YELLOW     Appearance CLEAR  CLEAR     Specific Gravity, Urine 1.019  1.005 - 1.030     pH 6.5  5.0 - 8.0     Glucose, UA NEGATIVE  NEGATIVE (mg/dL)    Hgb urine dipstick NEGATIVE  NEGATIVE     Bilirubin Urine NEGATIVE  NEGATIVE     Ketones, ur 15 (*) NEGATIVE (mg/dL)    Protein, ur NEGATIVE  NEGATIVE (mg/dL)    Urobilinogen, UA 0.2  0.0 - 1.0 (mg/dL)    Nitrite NEGATIVE  NEGATIVE     Leukocytes, UA NEGATIVE  NEGATIVE  MICROSCOPIC NOT DONE ON URINES WITH NEGATIVE PROTEIN, BLOOD, LEUKOCYTES, NITRITE, OR GLUCOSE <1000 mg/dL.  APTT     Status: Normal   Collection Time   10/03/11 12:54 AM      Component Value Range Comment   aPTT 28  24 - 37 (seconds)   PROTIME-INR     Status: Normal   Collection Time   10/03/11 12:54 AM      Component Value Range Comment   Prothrombin Time 13.3  11.6 - 15.2 (seconds)    INR 0.99  0.00 - 1.49      Dg Lumbar Spine Complete  10/03/2011  *RADIOLOGY REPORT*  Clinical Data: Fall  LUMBAR SPINE - COMPLETE 4+ VIEW  Comparison: None.  Findings: Anatomic alignment on the lateral view.  Mild levoscoliosis with the apex at L3-4.  No vertebral body height loss.  Severe multilevel  this space narrowing and osteophyte formation.  Osteopenia.  IMPRESSION: No acute bony pathology.  Chronic change.  Original Report Authenticated By: Donavan Burnet, M.D.   Dg Hip Complete Right  10/03/2011  *RADIOLOGY REPORT*  Clinical Data: Fall  RIGHT HIP - COMPLETE 2+ VIEW  Comparison: None.  Findings: Intertrochanteric right femur fracture is present with varus deformity.  Osteopenia.  Degenerative changes in the spine.  IMPRESSION: Intertrochanteric right femur fracture. Per CMS PQRS reporting requirements (PQRS Measure 24): Given the patient's age of greater than 50 and the fracture site (hip, distal radius, or spine), the patient should be tested for osteoporosis using DXA, and the appropriate treatment considered based on the DXA results.  Original Report Authenticated By: Donavan Burnet, M.D.   Dg Chest Portable 1 View  10/03/2011  *RADIOLOGY REPORT*  Clinical Data: Preoperative radiograph, hip fracture.  PORTABLE CHEST - 1 VIEW  Comparison: 10/29/2005 abdominal CT.  Findings: Prominent mediastinal contours may reflect aortic tortuosity however thoracic aortic aneurysm not excluded.  Heart size mildly enlarged.  Interstitial prominence.  Linear left lung base opacity likely reflects scarring or atelectasis.  Osteopenia. No definite acute osseous abnormality.  Small hiatal hernia.  IMPRESSION: Cardiomegaly and prominent mediastinal contours.  May reflect aortic tortuosity however aneurysmal dilatation not excluded.  Interstitial prominence.  Linear left lung base opacity likely reflects scarring or atelectasis.  Original Report Authenticated By: Waneta Martins, M.D.    Review of Systems  Constitutional: Negative for fever, chills, weight loss, malaise/fatigue and diaphoresis.  HENT: Negative.  Negative for neck pain.   Eyes: Negative.   Respiratory: Negative.   Cardiovascular: Negative.   Gastrointestinal: Negative.  Musculoskeletal: Positive for myalgias, back pain, joint pain and  falls.  Skin: Negative.   Neurological: Positive for weakness.  Endo/Heme/Allergies: Negative.   Psychiatric/Behavioral: Negative.    Blood pressure 136/56, pulse 89, temperature 98.3 F (36.8 C), temperature source Oral, resp. rate 16, height 5\' 8"  (1.727 m), SpO2 98.00%. Physical Exam  Constitutional: She appears well-developed and well-nourished.  HENT:  Head: Normocephalic and atraumatic.  Eyes: EOM are normal. Pupils are equal, round, and reactive to light.  Neck: Normal range of motion. Neck supple. No tracheal deviation present. No thyromegaly present.  Cardiovascular: Normal rate, regular rhythm, normal heart sounds and intact distal pulses.  Exam reveals no gallop and no friction rub.   No murmur heard. Respiratory: Effort normal and breath sounds normal. No respiratory distress. She has no wheezes. She has no rales. She exhibits no tenderness.  GI: Bowel sounds are normal. She exhibits no distension and no mass. There is no tenderness. There is no rebound and no guarding.  Musculoskeletal: She exhibits tenderness. She exhibits no edema.  Neurological: She is alert.  Skin: Skin is warm and dry. No rash noted. No erythema. No pallor.  Psychiatric:       Pt is very demented and all history is provided by the family    Assessment/Plan: 1.  Hip fx- as per ortho 2. Dementia- chronic and very advanced, will need to continue with her home meds except for aspirin and will also need to check H&H in am to make sure she does not bleed Will follow daily   Grier Vu 10/03/2011, 1:50 AM

## 2011-10-03 NOTE — Anesthesia Procedure Notes (Addendum)
Procedure Name: Intubation Date/Time: 10/03/2011 5:28 PM Performed by: Wray Kearns A Pre-anesthesia Checklist: Patient identified, Patient being monitored, Emergency Drugs available, Timeout performed and Suction available Patient Re-evaluated:Patient Re-evaluated prior to inductionOxygen Delivery Method: Circle System Utilized Preoxygenation: Pre-oxygenation with 100% oxygen Intubation Type: IV induction and Circoid Pressure applied Ventilation: Mask ventilation without difficulty Grade View: Grade I Tube type: Oral Tube size: 7.5 mm Number of attempts: 1 Airway Equipment and Method: stylet (Glidescope  Adult Used ) Placement Confirmation: ETT inserted through vocal cords under direct vision,  breath sounds checked- equal and bilateral and positive ETCO2 Secured at: 22 cm Tube secured with: Tape Dental Injury: Teeth and Oropharynx as per pre-operative assessment

## 2011-10-03 NOTE — ED Notes (Signed)
Pt placed on 2L O2, due to low sats

## 2011-10-03 NOTE — Brief Op Note (Signed)
10/02/2011 - 10/03/2011  6:57 PM  PATIENT:  Vickie Hoffman  77 y.o. female  PRE-OPERATIVE DIAGNOSIS:  Right Hip Intertrochanteric Fracture  POST-OPERATIVE DIAGNOSIS:  Right Hip Intertrochanteric Fracture  PROCEDURE:  Procedure(s): COMPRESSION HIP  SURGEON:  Surgeon(s): Kathryne Hitch  ANESTHESIA:   general  EBL:  Total I/O In: 0  Out: 1100 [Urine:700; Blood:400]  PATIENT DISPOSITION:  PACU - guarded condition.   Delay start of Pharmacological VTE agent (>24hrs) due to surgical blood loss or risk of bleeding:  {YES/NO/NOT APPLICABLE:20182

## 2011-10-03 NOTE — H&P (Signed)
Vickie Hoffman is an 77 y.o. female.   Chief Complaint: Right hip pain HPI: Inability to ambulate post mechanical fall.  Brought to ER. Found to have right hip fracture.  Ortho consulted. Complains of right hip pain.  Past Medical History  Diagnosis Date  . Dementia     No past surgical history on file.  No family history on file. Social History:  reports that she has never smoked. She does not have any smokeless tobacco history on file. She reports that she does not drink alcohol or use illicit drugs.  Allergies: No Known Allergies  Medications Prior to Admission  Medication Dose Route Frequency Provider Last Rate Last Dose  . 0.9 %  sodium chloride infusion   Intravenous Continuous Ward Givens, MD      . HYDROcodone-acetaminophen (NORCO) 5-325 MG per tablet 2 tablet  2 tablet Oral Once Ward Givens, MD   2 tablet at 10/02/11 2308  . morphine 4 MG/ML injection 4 mg  4 mg Intravenous Q2H PRN Ward Givens, MD      . ondansetron (ZOFRAN) injection 4 mg  4 mg Intravenous Q6H PRN Ward Givens, MD       No current outpatient prescriptions on file as of 10/02/2011.    Results for orders placed during the hospital encounter of 10/02/11 (from the past 48 hour(s))  CBC     Status: Abnormal   Collection Time   10/02/11 10:55 PM      Component Value Range Comment   WBC 10.1  4.0 - 10.5 (K/uL)    RBC 4.11  3.87 - 5.11 (MIL/uL)    Hemoglobin 11.8 (*) 12.0 - 15.0 (g/dL)    HCT 86.5  78.4 - 69.6 (%)    MCV 87.6  78.0 - 100.0 (fL)    MCH 28.7  26.0 - 34.0 (pg)    MCHC 32.8  30.0 - 36.0 (g/dL)    RDW 29.5  28.4 - 13.2 (%)    Platelets 209  150 - 400 (K/uL)   DIFFERENTIAL     Status: Abnormal   Collection Time   10/02/11 10:55 PM      Component Value Range Comment   Neutrophils Relative 82 (*) 43 - 77 (%)    Neutro Abs 8.3 (*) 1.7 - 7.7 (K/uL)    Lymphocytes Relative 11 (*) 12 - 46 (%)    Lymphs Abs 1.1  0.7 - 4.0 (K/uL)    Monocytes Relative 6  3 - 12 (%)    Monocytes Absolute 0.6  0.1 -  1.0 (K/uL)    Eosinophils Relative 1  0 - 5 (%)    Eosinophils Absolute 0.1  0.0 - 0.7 (K/uL)    Basophils Relative 0  0 - 1 (%)    Basophils Absolute 0.0  0.0 - 0.1 (K/uL)   COMPREHENSIVE METABOLIC PANEL     Status: Abnormal   Collection Time   10/02/11 10:55 PM      Component Value Range Comment   Sodium 138  135 - 145 (mEq/L)    Potassium 3.6  3.5 - 5.1 (mEq/L)    Chloride 99  96 - 112 (mEq/L)    CO2 30  19 - 32 (mEq/L)    Glucose, Bld 104 (*) 70 - 99 (mg/dL)    BUN 27 (*) 6 - 23 (mg/dL)    Creatinine, Ser 4.40 (*) 0.50 - 1.10 (mg/dL)    Calcium 9.5  8.4 - 10.5 (mg/dL)  Total Protein 6.7  6.0 - 8.3 (g/dL)    Albumin 3.4 (*) 3.5 - 5.2 (g/dL)    AST 15  0 - 37 (U/L)    ALT 15  0 - 35 (U/L)    Alkaline Phosphatase 120 (*) 39 - 117 (U/L)    Total Bilirubin 0.3  0.3 - 1.2 (mg/dL)    GFR calc non Af Amer 40 (*) >90 (mL/min)    GFR calc Af Amer 47 (*) >90 (mL/min)   URINALYSIS, ROUTINE W REFLEX MICROSCOPIC     Status: Abnormal   Collection Time   10/02/11 11:36 PM      Component Value Range Comment   Color, Urine YELLOW  YELLOW     Appearance CLEAR  CLEAR     Specific Gravity, Urine 1.019  1.005 - 1.030     pH 6.5  5.0 - 8.0     Glucose, UA NEGATIVE  NEGATIVE (mg/dL)    Hgb urine dipstick NEGATIVE  NEGATIVE     Bilirubin Urine NEGATIVE  NEGATIVE     Ketones, ur 15 (*) NEGATIVE (mg/dL)    Protein, ur NEGATIVE  NEGATIVE (mg/dL)    Urobilinogen, UA 0.2  0.0 - 1.0 (mg/dL)    Nitrite NEGATIVE  NEGATIVE     Leukocytes, UA NEGATIVE  NEGATIVE  MICROSCOPIC NOT DONE ON URINES WITH NEGATIVE PROTEIN, BLOOD, LEUKOCYTES, NITRITE, OR GLUCOSE <1000 mg/dL.   Dg Lumbar Spine Complete  10/03/2011  *RADIOLOGY REPORT*  Clinical Data: Fall  LUMBAR SPINE - COMPLETE 4+ VIEW  Comparison: None.  Findings: Anatomic alignment on the lateral view.  Mild levoscoliosis with the apex at L3-4.  No vertebral body height loss.  Severe multilevel this space narrowing and osteophyte formation.  Osteopenia.   IMPRESSION: No acute bony pathology.  Chronic change.  Original Report Authenticated By: Donavan Burnet, M.D.   Dg Hip Complete Right  10/03/2011  *RADIOLOGY REPORT*  Clinical Data: Fall  RIGHT HIP - COMPLETE 2+ VIEW  Comparison: None.  Findings: Intertrochanteric right femur fracture is present with varus deformity.  Osteopenia.  Degenerative changes in the spine.  IMPRESSION: Intertrochanteric right femur fracture. Per CMS PQRS reporting requirements (PQRS Measure 24): Given the patient's age of greater than 50 and the fracture site (hip, distal radius, or spine), the patient should be tested for osteoporosis using DXA, and the appropriate treatment considered based on the DXA results.  Original Report Authenticated By: Donavan Burnet, M.D.    ROS  Blood pressure 174/84, pulse 87, temperature 98.3 F (36.8 C), temperature source Oral, resp. rate 14, height 5\' 8"  (1.727 m), SpO2 94.00%. Physical Exam  Musculoskeletal:       Right hip: She exhibits decreased range of motion, tenderness and deformity.     Assessment/Plan Right Intertrochanteric Femur/Hip fracture 1) Admit to ortho with general medicine consult for medical clearance 2) NPO 3) will need open reduction/internal fixation of right hip likely late this evening  BLACKMAN,CHRISTOPHER Y 10/03/2011, 12:59 AM

## 2011-10-03 NOTE — Op Note (Signed)
Vickie Hoffman, Vickie Hoffman NO.:  [id number]  MEDICAL RECORD NO.:  [id number]  LOCATION:  5039                         FACILITY:  [facility]  PHYSICIAN:  Vanita Panda. Magnus Ivan, M.D.DATE OF BIRTH:  08-02-1920  DATE OF PROCEDURE:  10/03/2011 DATE OF DISCHARGE:                              OPERATIVE REPORT   PREOPERATIVE DIAGNOSIS:  Right intertrochanteric hip fracture.  POSTOPERATIVE DIAGNOSIS:  Right intertrochanteric hip fracture.  PROCEDURE:  Open reduction and internal fixation of right intertrochanteric hip fracture.  IMPLANTS:  Right intramedullary nail 10 x 36 with a 9 mm lag screw from [employer]  SURGEON:  Vanita Panda. Magnus Ivan, MD  ANESTHESIA:  General.  ANTIBIOTICS:  1 g IV Ancef.  BLOOD LOSS:  400 mL.  COMPLICATIONS:  None.  INDICATIONS:  Vickie Hoffman is an 77 year old female who lives at home with her elderly husband.  She has mild dementia.  She sustained a mechanical fall late last evening and was admitted to the emergency room with a right intertrochanteric hip fracture.  She was admitted to Orthopedics and seen by General Medicine who cleared her for surgery.  I talked to the family at length including her husband and son.  They recognized the risks and benefits of surgery, with risks being DVT, PE, pain, poor quality of life, and death and the benefits being hopefully able to get her ambulating and in less pain, and better quality of life.  PROCEDURES DESCRIPTION:  After informed consent was obtained, appropriate right hip was marked.  She was brought to the room and general anesthesia was obtained while she was on her stretcher.  Then she was placed on the fracture table with perineal post in place with appropriate padding around the perineal area and then the right operative hip was placed in in-line skeletal traction with the traction boot devices.  The left hip was flexed and abducted out of the field, placed in stirrup.  I then  brought the fluoroscopic machine in and under direct fluoroscopy, we assessed the fracture, and I was able to reduce it with traction and internal rotation.  Even under direct fluoroscopic guidance, I was able to take obtain the correct size for my implant with keeping the implant in a sterile box lying it on top of the leg we are able to measure out what she needed lengthwise of the nail.  We then had the right hip prepped and draped with DuraPrep and a sterile shower curtain drape.  A time-out was called, she was the correct patient, correct right hip, I then made an incision 2 fingerbreadths proximal to the to the greater trochanter and dissected down to the greater trochanter.  Under direct fluoroscopic guidance, a guidepin was inserted in antegrade fashion, and over the guide pin, I was able to use initiating reamer to open up the proximal femur.  I then placed the nail in antegrade fashion.  Using the outrigger guide down the femur without any need to ream.  Using the outrigger guide, a separate incision was made along the lateral aspect of the right leg and then a guidepin was inserted through the lateral femoral cortex traversing the fracture, the femoral neck and into the  femoral head.  I then measured for a 90 mm lag screw.  Drilled for this and then placed a 90 mm lag screw.  We then let the traction off and I placed a compression component of this as well to compress the fracture.  I was pleased with the reduction under direct fluoroscopy.  We then removed the outrigger guide.  I copiously irrigated both wounds.  I closed the proximal wound with 0 Vicryl, 2-0 Vicryl, and staples.  The distal wound experienced some more bleeding than expected.  I had to open that incision a little more and then cauterized the bleeding vessel, placed epi-soaked gel foam on this as well.  Good hemostasis was obtained.  She did lose 400 mL of blood, and I closed this wound as well.  She was then  awakened, extubated, and taken to recovery room, in stable/possibly guarded condition.     Vanita Panda. Magnus Ivan, M.D.     CYB/MEDQ  D:  10/03/2011  T:  10/03/2011  Job:  [id number]

## 2011-10-03 NOTE — Anesthesia Postprocedure Evaluation (Signed)
  Anesthesia Post-op Note  Patient: Vickie Hoffman  Procedure(s) Performed:  COMPRESSION HIP  Patient Location: PACU  Anesthesia Type: General  Level of Consciousness: awake  Airway and Oxygen Therapy: Patient Spontanous Breathing  Post-op Pain: mild  Post-op Assessment: Post-op Vital signs reviewed  Post-op Vital Signs: stable  Complications: No apparent anesthesia complications

## 2011-10-03 NOTE — Progress Notes (Signed)
Subjective: No events overnight.   Objective:  Vital signs in last 24 hours:  Filed Vitals:   10/02/11 2157 10/03/11 0121 10/03/11 0232 10/03/11 0700  BP: 174/84 136/56 178/73 123/73  Pulse: 87 89 90 90  Temp: 98.3 F (36.8 C)  98 F (36.7 C) 98 F (36.7 C)  TempSrc: Oral  Oral   Resp: 14 16 16 18   Height: 5\' 8"  (1.727 m)  5\' 8"  (1.727 m)   Weight:   62.46 kg (137 lb 11.2 oz)   SpO2: 94% 98% 97% 97%    Intake/Output from previous day:   Intake/Output Summary (Last 24 hours) at 10/03/11 1226 Last data filed at 10/03/11 0700  Gross per 24 hour  Intake    690 ml  Output    400 ml  Net    290 ml    Physical Exam: General: Alert, awake, oriented to name only, in no acute distress. HEENT: No bruits, no goiter. Heart: Regular rate and rhythm, without murmurs, rubs, gallops. Lungs: Clear to auscultation bilaterally with decreased sounds at bases Abdomen: Soft, nontender, nondistended, positive bowel sounds. Extremities: No clubbing cyanosis or edema with positive pedal pulses. Neuro: Grossly intact, nonfocal.   Lab Results:  Basic Metabolic Panel:    Component Value Date/Time   NA 138 10/02/2011 2255   K 3.6 10/02/2011 2255   CL 99 10/02/2011 2255   CO2 30 10/02/2011 2255   BUN 27* 10/02/2011 2255   CREATININE 1.16* 10/02/2011 2255   GLUCOSE 104* 10/02/2011 2255   CALCIUM 9.5 10/02/2011 2255   CBC:    Component Value Date/Time   WBC 10.1 10/02/2011 2255   HGB 11.8* 10/02/2011 2255   HCT 36.0 10/02/2011 2255   PLT 209 10/02/2011 2255   MCV 87.6 10/02/2011 2255   NEUTROABS 8.3* 10/02/2011 2255   LYMPHSABS 1.1 10/02/2011 2255   MONOABS 0.6 10/02/2011 2255   EOSABS 0.1 10/02/2011 2255   BASOSABS 0.0 10/02/2011 2255    Recent Results (from the past 240 hour(s))  SURGICAL PCR SCREEN     Status: Normal   Collection Time   10/03/11  3:01 AM      Component Value Range Status Comment   MRSA, PCR NEGATIVE  NEGATIVE  Final    Staphylococcus aureus NEGATIVE  NEGATIVE  Final       Studies/Results: Dg Lumbar Spine Complete  10/03/2011  *RADIOLOGY REPORT*  Clinical Data: Fall  LUMBAR SPINE - COMPLETE 4+ VIEW  Comparison: None.  Findings: Anatomic alignment on the lateral view.  Mild levoscoliosis with the apex at L3-4.  No vertebral body height loss.  Severe multilevel this space narrowing and osteophyte formation.  Osteopenia.  IMPRESSION: No acute bony pathology.  Chronic change.  Original Report Authenticated By: Donavan Burnet, M.D.   Dg Hip Complete Right  10/03/2011  *RADIOLOGY REPORT*  Clinical Data: Fall  RIGHT HIP - COMPLETE 2+ VIEW  Comparison: None.  Findings: Intertrochanteric right femur fracture is present with varus deformity.  Osteopenia.  Degenerative changes in the spine.  IMPRESSION: Intertrochanteric right femur fracture. Per CMS PQRS reporting requirements (PQRS Measure 24): Given the patient's age of greater than 50 and the fracture site (hip, distal radius, or spine), the patient should be tested for osteoporosis using DXA, and the appropriate treatment considered based on the DXA results.  Original Report Authenticated By: Donavan Burnet, M.D.   Dg Chest Portable 1 View  10/03/2011  *RADIOLOGY REPORT*  Clinical Data: Preoperative radiograph, hip fracture.  PORTABLE  CHEST - 1 VIEW  Comparison: 10/29/2005 abdominal CT.  Findings: Prominent mediastinal contours may reflect aortic tortuosity however thoracic aortic aneurysm not excluded.  Heart size mildly enlarged.  Interstitial prominence.  Linear left lung base opacity likely reflects scarring or atelectasis.  Osteopenia. No definite acute osseous abnormality.  Small hiatal hernia.  IMPRESSION: Cardiomegaly and prominent mediastinal contours.  May reflect aortic tortuosity however aneurysmal dilatation not excluded.  Interstitial prominence.  Linear left lung base opacity likely reflects scarring or atelectasis.  Original Report Authenticated By: Waneta Martins, M.D.    Medications: Scheduled Meds:    . aspirin  81 mg Oral Daily  . buPROPion  200 mg Oral BID  . donepezil  10 mg Oral QHS  . HYDROcodone-acetaminophen  2 tablet Oral Once  . LORazepam  1 mg Oral QHS  . multivitamins ther. w/minerals  1 tablet Oral Daily  . oxybutynin  5 mg Oral BID  . venlafaxine  75 mg Oral Daily   Continuous Infusions:   . sodium chloride    . sodium chloride     PRN Meds:.HYDROcodone-acetaminophen, methocarbamol(ROBAXIN) IV, methocarbamol, morphine injection, ondansetron  Assessment/Plan:  Principal Problem:  *Femur fracture, right - per primary team  Active Problems:  Dementia - continue Donepezil as per home regimen  HTN (hypertension) - continue to monitor and initiate Amlodipine low dose  ARF (acute renal failure) - likely acute on chronic and perhaps even pt's baseline but we have no other data to compare, will continue gentle hydration and obtain urine Na and urine Cr, follow strict I's and O's. Obtain BMP in AM  Anemia - unclear what the pt's baseline is, will obtain anemia panel and CBC in AM     LOS: 1 day   MAGICK-MYERS, ISKRA 10/03/2011, 12:26 PM

## 2011-10-03 NOTE — ED Provider Notes (Signed)
Late entry    Date: 10/03/2011  Rate: 90  Rhythm: normal sinus rhythm  QRS Axis: normal  Intervals: normal  ST/T Wave abnormalities: nonspecific ST/T changes  Conduction Disutrbances:left bundle branch block  Narrative Interpretation:   Old EKG Reviewed: none available  Devoria Albe, MD, Armando Gang   Ward Givens, MD 10/03/11 1035

## 2011-10-03 NOTE — Anesthesia Preprocedure Evaluation (Addendum)
Anesthesia Evaluation  Patient identified by MRN, date of birth, ID band Patient awake and Patient confused    Reviewed: Allergy & Precautions, H&P , NPO status , Patient's Chart, lab work & pertinent test results  Airway Mallampati: III TM Distance: <3 FB Neck ROM: Full  Mouth opening: Limited Mouth Opening  Dental  (+) Teeth Intact and Dental Advisory Given   Pulmonary    Pulmonary exam normal       Cardiovascular hypertension, Pt. on medications Regular Normal    Neuro/Psych    GI/Hepatic   Endo/Other    Renal/GU      Musculoskeletal   Abdominal Normal abdominal exam  (+)   Peds  Hematology   Anesthesia Other Findings   Reproductive/Obstetrics                          Anesthesia Physical Anesthesia Plan  ASA: III and Emergent  Anesthesia Plan: General   Post-op Pain Management:    Induction: Intravenous  Airway Management Planned: Oral ETT  Additional Equipment:   Intra-op Plan:   Post-operative Plan: Extubation in OR  Informed Consent: I have reviewed the patients History and Physical, chart, labs and discussed the procedure including the risks, benefits and alternatives for the proposed anesthesia with the patient or authorized representative who has indicated his/her understanding and acceptance.   Dental advisory given  Plan Discussed with: CRNA and Surgeon  Anesthesia Plan Comments:         Anesthesia Quick Evaluation

## 2011-10-03 NOTE — Progress Notes (Signed)
ANTICOAGULATION CONSULT NOTE - Initial Consult  Pharmacy Consult for coumadin Indication: VTE prophylaxis  Allergies  Allergen Reactions  . Statins   . Sulfa Antibiotics     Patient Measurements: Height: 5\' 8"  (172.7 cm) Weight: 137 lb 11.2 oz (62.46 kg) IBW/kg (Calculated) : 63.9  Adjusted Body Weight:   Vital Signs: Temp: 99.3 F (37.4 C) (11/09 2042) Temp src: Oral (11/09 1632) BP: 141/60 mmHg (11/09 2040) Pulse Rate: 96  (11/09 2046)  Labs:  Basename 10/03/11 0054 10/02/11 2255  HGB -- 11.8*  HCT -- 36.0  PLT -- 209  APTT 28 --  LABPROT 13.3 --  INR 0.99 --  HEPARINUNFRC -- --  CREATININE -- 1.16*  CKTOTAL -- --  CKMB -- --  TROPONINI -- --   Estimated Creatinine Clearance: 32.4 ml/min (by C-G formula based on Cr of 1.16).  Medical History: Past Medical History  Diagnosis Date  . Dementia     Medications:  Scheduled:    . amLODipine  5 mg Oral Daily  . aspirin  81 mg Oral Daily  . buPROPion  200 mg Oral BID  . donepezil  10 mg Oral QHS  . HYDROcodone-acetaminophen  2 tablet Oral Once  . LORazepam  1 mg Oral QHS  . multivitamins ther. w/minerals  1 tablet Oral Daily  . oxybutynin  5 mg Oral BID  . venlafaxine  75 mg Oral Daily    Assessment: 77 yo female s/p hip compression will be started on coumadin. INR 0.99. Coumadin score = 1 Goal of Therapy:  INR 2-3   Plan:  1) Coumadin 2.5mg  po x1; 2) daily PT/INR  Hikeem Andersson, Tsz-Yin 10/03/2011,10:18 PM

## 2011-10-03 NOTE — Preoperative (Signed)
Beta Blockers   Reason not to administer Beta Blockers:Not Applicable 

## 2011-10-03 NOTE — Progress Notes (Signed)
Subjective: Awake. Follows commands.  Dementia   Objective: Vital signs in last 24 hours: Temp:  [98 F (36.7 C)-98.3 F (36.8 C)] 98 F (36.7 C) (11/09 0232) Pulse Rate:  [87-90] 90  (11/09 0232) Resp:  [14-16] 16  (11/09 0232) BP: (136-178)/(56-84) 178/73 mmHg (11/09 0232) SpO2:  [94 %-98 %] 97 % (11/09 0232) Weight:  [62.46 kg (137 lb 11.2 oz)] 137 lb 11.2 oz (62.46 kg) (11/09 0232)  Intake/Output from previous day: 11/08 0701 - 11/09 0700 In: 450 [I.V.:450] Out: -  Intake/Output this shift:     Basename 10/02/11 2255  HGB 11.8*    Basename 10/02/11 2255  WBC 10.1  RBC 4.11  HCT 36.0  PLT 209    Basename 10/02/11 2255  NA 138  K 3.6  CL 99  CO2 30  BUN 27*  CREATININE 1.16*  GLUCOSE 104*  CALCIUM 9.5    Basename 10/03/11 0054  LABPT --  INR 0.99    Physical Exam: Right hip shortened.  Pain with motion. NVI  Assessment/Plan: Right hip fracture (intertroch) 1) to OR today for ORIF right hip   Vickie Hoffman 10/03/2011, 7:42 AM

## 2011-10-03 NOTE — Transfer of Care (Signed)
Immediate Anesthesia Transfer of Care Note  Patient: Vickie Hoffman  Procedure(s) Performed:  COMPRESSION HIP  Patient Location: PACU  Anesthesia Type: General  Level of Consciousness: sedated, patient cooperative, lethargic and responds to stimulation  Airway & Oxygen Therapy: Patient Spontanous Breathing and Patient connected to face mask oxygen  Post-op Assessment: Report given to PACU RN  Post vital signs: Reviewed and stable  Complications: No apparent anesthesia complications

## 2011-10-04 LAB — CBC
HCT: 26.4 % — ABNORMAL LOW (ref 36.0–46.0)
Hemoglobin: 8.5 g/dL — ABNORMAL LOW (ref 12.0–15.0)
MCHC: 32.2 g/dL (ref 30.0–36.0)
RDW: 14.3 % (ref 11.5–15.5)
WBC: 9.3 10*3/uL (ref 4.0–10.5)

## 2011-10-04 LAB — BASIC METABOLIC PANEL
BUN: 25 mg/dL — ABNORMAL HIGH (ref 6–23)
Chloride: 102 mEq/L (ref 96–112)
GFR calc Af Amer: 45 mL/min — ABNORMAL LOW (ref >90)
GFR calc non Af Amer: 39 mL/min — ABNORMAL LOW (ref >90)
Potassium: 4.5 mEq/L (ref 3.5–5.1)
Sodium: 138 mEq/L (ref 135–145)

## 2011-10-04 MED ORDER — WARFARIN SODIUM 2.5 MG PO TABS
2.5000 mg | ORAL_TABLET | Freq: Once | ORAL | Status: AC
  Administered 2011-10-04: 2.5 mg via ORAL
  Filled 2011-10-04: qty 1

## 2011-10-04 MED ORDER — DIPHENHYDRAMINE HCL 12.5 MG/5ML PO ELIX
12.5000 mg | ORAL_SOLUTION | ORAL | Status: DC | PRN
  Filled 2011-10-04: qty 10

## 2011-10-04 MED ORDER — METOCLOPRAMIDE HCL 5 MG/ML IJ SOLN
5.0000 mg | Freq: Three times a day (TID) | INTRAMUSCULAR | Status: DC | PRN
  Filled 2011-10-04: qty 2

## 2011-10-04 MED ORDER — SENNOSIDES-DOCUSATE SODIUM 8.6-50 MG PO TABS
1.0000 | ORAL_TABLET | Freq: Every evening | ORAL | Status: DC | PRN
  Filled 2011-10-04: qty 1

## 2011-10-04 MED ORDER — METHOCARBAMOL 100 MG/ML IJ SOLN
500.0000 mg | Freq: Four times a day (QID) | INTRAVENOUS | Status: DC | PRN
  Filled 2011-10-04: qty 5

## 2011-10-04 MED ORDER — ZOLPIDEM TARTRATE 5 MG PO TABS: 5.0000 mg | ORAL_TABLET | Freq: Every evening | ORAL | Status: DC | PRN

## 2011-10-04 MED ORDER — FERROUS SULFATE 325 (65 FE) MG PO TABS
325.0000 mg | ORAL_TABLET | Freq: Two times a day (BID) | ORAL | Status: DC
  Administered 2011-10-04 – 2011-10-09 (×8): 325 mg via ORAL
  Filled 2011-10-04 (×15): qty 1

## 2011-10-04 MED ORDER — CEFAZOLIN SODIUM 1-5 GM-% IV SOLN
1.0000 g | Freq: Three times a day (TID) | INTRAVENOUS | Status: AC
  Administered 2011-10-04 (×3): 1 g via INTRAVENOUS
  Filled 2011-10-04 (×3): qty 50

## 2011-10-04 MED ORDER — MORPHINE SULFATE 2 MG/ML IJ SOLN: 1.0000 mg | Freq: Four times a day (QID) | INTRAMUSCULAR | Status: DC | PRN

## 2011-10-04 MED ORDER — METOCLOPRAMIDE HCL 10 MG PO TABS
5.0000 mg | ORAL_TABLET | Freq: Three times a day (TID) | ORAL | Status: DC | PRN
  Filled 2011-10-04: qty 1

## 2011-10-04 MED ORDER — HYDROCODONE-ACETAMINOPHEN 5-325 MG PO TABS: 1.0000 | ORAL_TABLET | ORAL | Status: DC | PRN

## 2011-10-04 MED ORDER — METHOCARBAMOL 500 MG PO TABS
500.0000 mg | ORAL_TABLET | Freq: Four times a day (QID) | ORAL | Status: DC | PRN
  Administered 2011-10-05: 500 mg via ORAL
  Filled 2011-10-04: qty 1

## 2011-10-04 MED ORDER — SODIUM CHLORIDE 0.9 % IV SOLN
INTRAVENOUS | Status: DC
  Administered 2011-10-04 – 2011-10-05 (×3): via INTRAVENOUS
  Administered 2011-10-06: 75 mL/h via INTRAVENOUS
  Administered 2011-10-07: 18:00:00 via INTRAVENOUS

## 2011-10-04 MED ORDER — ONDANSETRON HCL 4 MG/2ML IJ SOLN: 4.0000 mg | Freq: Four times a day (QID) | INTRAMUSCULAR | Status: DC | PRN

## 2011-10-04 MED ORDER — DIPHENHYDRAMINE HCL 12.5 MG/5ML PO ELIX
6.2500 mg | ORAL_SOLUTION | Freq: Two times a day (BID) | ORAL | Status: DC | PRN
  Administered 2011-10-05: 6.25 mg via ORAL
  Filled 2011-10-04: qty 5

## 2011-10-04 MED ORDER — ONDANSETRON HCL 4 MG PO TABS: 4.0000 mg | ORAL_TABLET | Freq: Four times a day (QID) | ORAL | Status: DC | PRN

## 2011-10-04 MED ORDER — OXYCODONE HCL 5 MG PO TABS
5.0000 mg | ORAL_TABLET | ORAL | Status: DC | PRN
  Administered 2011-10-05 – 2011-10-07 (×4): 10 mg via ORAL
  Filled 2011-10-04 (×4): qty 2

## 2011-10-04 MED ORDER — HYDROCODONE-ACETAMINOPHEN 5-325 MG PO TABS
1.0000 | ORAL_TABLET | ORAL | Status: DC | PRN
  Filled 2011-10-04: qty 1

## 2011-10-04 MED ORDER — OXYCODONE HCL 5 MG PO TABS: 5.0000 mg | ORAL_TABLET | ORAL | Status: DC | PRN

## 2011-10-04 MED ORDER — MORPHINE SULFATE 2 MG/ML IJ SOLN: 1.0000 mg | INTRAMUSCULAR | Status: DC | PRN

## 2011-10-04 NOTE — Plan of Care (Signed)
Problem: Phase II Progression Outcomes Goal: Bed to chair Outcome: Completed/Met Date Met:  10/04/11 Patient required +2 assist for transfers.

## 2011-10-04 NOTE — Progress Notes (Signed)
Subjective: No events overnight.   Objective:  Vital signs in last 24 hours:  Filed Vitals:   10/03/11 2050 10/03/11 2247 10/04/11 0705 10/04/11 0707  BP:  137/67 140/62 139/86  Pulse:  97 89 124  Temp:  98.8 F (37.1 C) 98.7 F (37.1 C) 99.1 F (37.3 C)  TempSrc:      Resp:  19 20 20   Height:      Weight:      SpO2: 91% 97% 89% 93%    Intake/Output from previous day:   Intake/Output Summary (Last 24 hours) at 10/04/11 1138 Last data filed at 10/04/11 0830  Gross per 24 hour  Intake 1808.75 ml  Output   1100 ml  Net 708.75 ml    Physical Exam: General: Alert, awake, oriented to name only, in no acute distress. HEENT: No bruits, no goiter. Heart: Regular rate and rhythm, without murmurs, rubs, gallops. Lungs: Clear to auscultation bilaterally. Decreased sounds at bases Abdomen: Soft, nontender, nondistended, positive bowel sounds. Extremities: No clubbing cyanosis or edema with positive pedal pulses. Neuro: Grossly intact, nonfocal.   Lab Results:  Basic Metabolic Panel:    Component Value Date/Time   NA 138 10/04/2011 0700   K 4.5 10/04/2011 0700   CL 102 10/04/2011 0700   CO2 29 10/04/2011 0700   BUN 25* 10/04/2011 0700   CREATININE 1.20* 10/04/2011 0700   GLUCOSE 118* 10/04/2011 0700   CALCIUM 8.8 10/04/2011 0700   CBC:    Component Value Date/Time   WBC 9.3 10/04/2011 0700   HGB 8.5* 10/04/2011 0700   HCT 26.4* 10/04/2011 0700   PLT 177 10/04/2011 0700   MCV 89.8 10/04/2011 0700   NEUTROABS 8.3* 10/02/2011 2255   LYMPHSABS 1.1 10/02/2011 2255   MONOABS 0.6 10/02/2011 2255   EOSABS 0.1 10/02/2011 2255   BASOSABS 0.0 10/02/2011 2255    Recent Results (from the past 240 hour(s))  SURGICAL PCR SCREEN     Status: Normal   Collection Time   10/03/11  3:01 AM      Component Value Range Status Comment   MRSA, PCR NEGATIVE  NEGATIVE  Final    Staphylococcus aureus NEGATIVE  NEGATIVE  Final     Studies/Results: Dg Lumbar Spine  Complete  10/03/2011  *RADIOLOGY REPORT*  Clinical Data: Fall  LUMBAR SPINE - COMPLETE 4+ VIEW  Comparison: None.  Findings: Anatomic alignment on the lateral view.  Mild levoscoliosis with the apex at L3-4.  No vertebral body height loss.  Severe multilevel this space narrowing and osteophyte formation.  Osteopenia.  IMPRESSION: No acute bony pathology.  Chronic change.  Original Report Authenticated By: Donavan Burnet, M.D.   Dg Hip Complete Right Done In Or 15 By Rmm  10/03/2011  *RADIOLOGY REPORT*  Clinical Data: Intertrochanteric fracture of the right hip.  RIGHT HIP - COMPLETE 2+ VIEW  Comparison: 10/02/2011.  Findings: Multiple fluoroscopic spot images demonstrate placement of an intermedullary rod and dynamic hip screw fixating the hip fracture with anatomic alignment.  IMPRESSION: Internal fixation with anatomic alignment.  Original Report Authenticated By: P. Loralie Champagne, M.D.   Dg Hip Complete Right  10/03/2011  *RADIOLOGY REPORT*  Clinical Data: Fall  RIGHT HIP - COMPLETE 2+ VIEW  Comparison: None.  Findings: Intertrochanteric right femur fracture is present with varus deformity.  Osteopenia.  Degenerative changes in the spine.  IMPRESSION: Intertrochanteric right femur fracture. Per CMS PQRS reporting requirements (PQRS Measure 24): Given the patient's age of greater than 50 and the fracture  site (hip, distal radius, or spine), the patient should be tested for osteoporosis using DXA, and the appropriate treatment considered based on the DXA results.  Original Report Authenticated By: Donavan Burnet, M.D.   Dg Chest Portable 1 View  10/03/2011  *RADIOLOGY REPORT*  Clinical Data: Preoperative radiograph, hip fracture.  PORTABLE CHEST - 1 VIEW  Comparison: 10/29/2005 abdominal CT.  Findings: Prominent mediastinal contours may reflect aortic tortuosity however thoracic aortic aneurysm not excluded.  Heart size mildly enlarged.  Interstitial prominence.  Linear left lung base opacity likely  reflects scarring or atelectasis.  Osteopenia. No definite acute osseous abnormality.  Small hiatal hernia.  IMPRESSION: Cardiomegaly and prominent mediastinal contours.  May reflect aortic tortuosity however aneurysmal dilatation not excluded.  Interstitial prominence.  Linear left lung base opacity likely reflects scarring or atelectasis.  Original Report Authenticated By: Waneta Martins, M.D.   Dg C-arm 1-60 Min  10/03/2011  CLINICAL DATA: orif right hip fx   C-ARM 1-60 MINUTES  Fluoroscopy was utilized by the requesting physician.  No radiographic  interpretation.      Medications: Scheduled Meds:   . amLODipine  5 mg Oral Daily  . aspirin  81 mg Oral Daily  . buPROPion  200 mg Oral BID  . ceFAZolin (ANCEF) IV  1 g Intravenous Q8H  . donepezil  10 mg Oral QHS  . LORazepam  1 mg Oral QHS  . multivitamins ther. w/minerals  1 tablet Oral Daily  . oxybutynin  5 mg Oral BID  . patient's guide to using coumadin book   Does not apply Once  . venlafaxine  75 mg Oral Daily  . warfarin  2.5 mg Oral Once  . warfarin   Does not apply Once   Continuous Infusions:   . sodium chloride    . sodium chloride 75 mL/hr at 10/04/11 0052  . DISCONTD: sodium chloride 75 mL/hr at 10/03/11 1412   PRN Meds:.diphenhydrAMINE, HYDROcodone-acetaminophen, methocarbamol(ROBAXIN) IV, methocarbamol, metoCLOPramide (REGLAN) injection, metoCLOPramide, morphine, ondansetron (ZOFRAN) IV, ondansetron, oxyCODONE, senna-docusate, zolpidem, DISCONTD: HYDROcodone-acetaminophen, DISCONTD: methocarbamol(ROBAXIN) IV, DISCONTD: methocarbamol, DISCONTD:  morphine injection, DISCONTD: ondansetron, DISCONTD: Surgifoam 1 Gm with Thrombin 5,000 units (5 ml) topical solution  Assessment/Plan:  Principal Problem:  *Femur fracture, right - per primary team   Active Problems:  Dementia - continue Donepezil as per home regimen  HTN (hypertension) - continue to monitor on Norvasc, will increase the dose if indicated ARF (acute  renal failure) - likely acute on chronic and perhaps even pt's baseline but we have no other data to compare, will continue gentle hydration follow strict I's and O's. Urine sodium and creatinine are suggestive of prerenal etiology. Will obtain follow up BMP in AM. Anemia - unclear what the pt's baseline is, anemia panel indicative of IDA and now with combination of likely post op blood loss, will obtain CBC in AM. Start iron PO.     LOS: 2 days   MAGICK-Shameika Speelman 10/04/2011, 11:38 AM

## 2011-10-04 NOTE — Progress Notes (Signed)
10/04/11 1615 Tera Mater, RN, BSN [phone] POD #1 S/P right hip surgery.  Pt. will likely need ST-SNF.  Consulted CSW for SNF.  Will continue to monitor for further discharge needs.

## 2011-10-04 NOTE — Progress Notes (Signed)
Physical Therapy Evaluation Patient Details Name: Vickie Hoffman MRN: [id number] DOB: Jun 30, 1920 Today's Date: 10/04/2011  Problem List:  Patient Active Problem List  Diagnoses  . Femur fracture, right  . Dementia  . HTN (hypertension)  . ARF (acute renal failure)  . Anemia    Past Medical History:  Past Medical History  Diagnosis Date  . Dementia    Past Surgical History: No past surgical history on file.  PT Assessment/Plan/Recommendation PT Assessment Clinical Impression Statement: Patient s/p ORIF right hip fracture.  Patient with significant dementia and pain.  Patient with history of multiple falls.  Progress with PT will be slow . PT Recommendation/Assessment: Patient will need skilled PT in the acute care venue PT Problem List: Decreased strength;Decreased range of motion;Decreased activity tolerance;Decreased balance;Decreased mobility;Decreased cognition;Decreased knowledge of use of DME;Decreased safety awareness;Pain PT Therapy Diagnosis : Difficulty walking;Generalized weakness;Acute pain;Altered mental status PT Plan PT Frequency: Min 5X/week PT Treatment/Interventions: DME instruction;Gait training;Functional mobility training;Therapeutic exercise;Balance training;Patient/family education PT Recommendation Follow Up Recommendations: Skilled nursing facility Equipment Recommended: Defer to next venue PT Goals  Acute Rehab PT Goals PT Goal Formulation: Patient unable to participate in goal setting Time For Goal Achievement: 2 weeks Pt will go Supine/Side to Sit: with min assist;with HOB 0 degrees;with rail PT Goal: Supine/Side to Sit - Progress: Not met Pt will go Sit to Supine/Side: with min assist;with HOB 0 degrees;with rail PT Goal: Sit to Supine/Side - Progress: Not met Pt will Transfer Sit to Stand/Stand to Sit: with mod assist;with upper extremity assist PT Transfer Goal: Sit to Stand/Stand to Sit - Progress: Not met Pt will Transfer Bed to  Chair/Chair to Bed: with mod assist PT Transfer Goal: Bed to Chair/Chair to Bed - Progress: Not met Pt will Ambulate: 16 - 50 feet;with mod assist;with rolling walker PT Goal: Ambulate - Progress: Not met  PT Evaluation Precautions/Restrictions  Precautions Precautions: Fall Required Braces or Orthoses: No Restrictions Weight Bearing Restrictions: Yes RLE Weight Bearing: Weight bearing as tolerated Prior Functioning  Home Living Lives With: Spouse Receives Help From: Family Type of Home: House Home Layout: One level Home Access: Ramped entrance Home Adaptive Equipment: Walker - rolling;Straight cane Prior Function Level of Independence: Requires assistive device for independence;Needs assistance with ADLs Bath: Moderate Toileting: Minimal Dressing: Minimal Driving: No Cognition Cognition Arousal/Alertness: Lethargic Overall Cognitive Status: Impaired Attention: Impaired Current Attention Level: Focused Attention - Other Comments: Needs redirection to stay on task Memory: Appears impaired Memory Deficits: Unable to name family members.  Unable to give information on home or prior functional status. Orientation Level: Oriented to person;Disoriented to place;Disoriented to time;Disoriented to situation Following Commands: Follows one step commands inconsistently;Follows one step commands with increased time Safety/Judgement: Decreased awareness of safety precautions;Decreased safety judgement for tasks assessed Decreased Safety/Judgement: Decreased awareness of need for assistance Safety/Judgement - Other Comments: Patient unaware of deficits, need for assistance.  However, pain limits ability to move. Awareness of Deficits: Decreased awareness of deficits Cognition - Other Comments: Dementia worsening PTA per family. Sensation/Coordination Sensation Light Touch: Not tested Coordination Gross Motor Movements are Fluid and Coordinated: Not tested Extremity Assessment RLE  Assessment RLE Assessment: Exceptions to [place] RLE AROM (degrees) Overall AROM Right Lower Extremity: Within functional limits for tasks assessed;Other (Comment) (Tested functionally with mobility.) RLE Strength RLE Overall Strength: Deficits;Due to pain;Due to impaired cognition (Required assist to move RLE) LLE Assessment LLE Assessment: Exceptions to [facility] LLE AROM (degrees) Overall AROM Left Lower Extremity: Within functional limits for tasks assessed LLE  Strength LLE Overall Strength: Deficits;Due to premorbid status;Due to impaired cognition (Grossly 4-/5 - General weakness) Mobility (including Balance) Bed Mobility Bed Mobility: Yes Supine to Sit: 1: +2 Total assist;Patient percentage (comment);HOB flat (Patient = 10%) Supine to Sit Details (indicate cue type and reason): Patient unable to follow commands to assist with mobility today.  Pain also limiting mobility. Sitting - Scoot to Edge of Bed: 1: +2 Total assist;Patient percentage (comment) (Patient = 10%) Sitting - Scoot to Edge of Bed Details (indicate cue type and reason): Tactile cues and manual facilitation to scoot to edge of bed. Transfers Transfers: Yes Sit to Stand: 1: +2 Total assist;Patient percentage (comment);From bed (Patient = 20%) Sit to Stand Details (indicate cue type and reason): Verbal, tactile, and manual cues to transition to standing. Stand to Sit: 1: +2 Total assist;Patient percentage (comment);To chair/3-in-1 (Patient = 20%) Stand to Sit Details: Verbal, tactile, and manual cues to sit back into recliner. Stand Pivot Transfers: 1: +2 Total assist (Patient = 20%) Stand Pivot Transfer Details (indicate cue type and reason): Manual facilitation, verbal cues for technique for transfer. Ambulation/Gait Ambulation/Gait: No  Balance Balance Assessed: Yes Static Sitting Balance Static Sitting - Balance Support: Bilateral upper extremity supported;Feet supported Static Sitting - Level of Assistance: 3: Mod  assist Static Sitting - Comment/# of Minutes: Patient required max verbal and tactile cuing to remain upright.  Leaning posteriorly.     End of Session PT - End of Session Equipment Utilized During Treatment: Gait belt Activity Tolerance: Patient limited by pain;Treatment limited secondary to medical complications (Comment) Patient left: in chair;with call bell in reach;with family/visitor present Nurse Communication: Mobility status for transfers General Behavior During Session: Lethargic Cognition: Impaired Cognitive Impairment: Significant dementia  Vena Austria  [phone] 10/04/2011, 5:53 PM

## 2011-10-04 NOTE — Progress Notes (Signed)
ANTICOAGULATION CONSULT NOTE - Initial Consult  Pharmacy Consult for coumadin Indication: VTE prophylaxis  Allergies  Allergen Reactions  . Statins   . Sulfa Antibiotics     Patient Measurements: Height: 5\' 8"  (172.7 cm) Weight: 137 lb 11.2 oz (62.46 kg) IBW/kg (Calculated) : 63.9  Adjusted Body Weight:   Vital Signs: Temp: 99.1 F (37.3 C) (11/10 0707) BP: 139/86 mmHg (11/10 0707) Pulse Rate: 124  (11/10 0707)  Labs:  Basename 10/04/11 0700 10/03/11 0054 10/02/11 2255  HGB 8.5* -- 11.8*  HCT 26.4* -- 36.0  PLT 177 -- 209  APTT -- 28 --  LABPROT 14.9 13.3 --  INR 1.15 0.99 --  HEPARINUNFRC -- -- --  CREATININE 1.20* -- 1.16*  CKTOTAL -- -- --  CKMB -- -- --  TROPONINI -- -- --   Estimated Creatinine Clearance: 31.4 ml/min (by C-G formula based on Cr of 1.2).  Medical History: Past Medical History  Diagnosis Date  . Dementia     Medications:  Scheduled:     . amLODipine  5 mg Oral Daily  . aspirin  81 mg Oral Daily  . buPROPion  200 mg Oral BID  . ceFAZolin (ANCEF) IV  1 g Intravenous Q8H  . donepezil  10 mg Oral QHS  . LORazepam  1 mg Oral QHS  . multivitamins ther. w/minerals  1 tablet Oral Daily  . oxybutynin  5 mg Oral BID  . patient's guide to using coumadin book   Does not apply Once  . venlafaxine  75 mg Oral Daily  . warfarin  2.5 mg Oral Once  . warfarin   Does not apply Once    Assessment: 77 yo female s/p hip compression will be started on coumadin. INR 1.14<---0.99. Coumadin score = 1 Goal of Therapy:  INR 2-3   Plan:  1) Coumadin 2.5mg  po x1; 2) daily PT/INR  Cian Costanzo, [place] R 10/04/2011,11:01 AM

## 2011-10-04 NOTE — Progress Notes (Signed)
Subjective: Pt stable reports lr leg pain   Objective: Vital signs in last 24 hours: Temp:  [97.8 F (36.6 C)-99.3 F (37.4 C)] 99.1 F (37.3 C) (11/10 0707) Pulse Rate:  [89-124] 124  (11/10 0707) Resp:  [13-22] 20  (11/10 0707) BP: (131-177)/(58-86) 139/86 mmHg (11/10 0707) SpO2:  [88 %-98 %] 93 % (11/10 0707)  Intake/Output from previous day: 11/09 0701 - 11/10 0700 In: 1683.8 [I.V.:1083.8; IV Piggyback:600] Out: 1100 [Urine:700; Blood:400] Intake/Output this shift:    Exam:  Neurovascular intact Sensation intact distally Intact pulses distally Dorsiflexion/Plantar flexion intact distal thigh dressing with mild quarter sized draining area  Labs:  Basename 10/04/11 0700 10/02/11 2255  HGB 8.5* 11.8*    Basename 10/04/11 0700 10/02/11 2255  WBC 9.3 10.1  RBC 2.94* 4.11  HCT 26.4* 36.0  PLT 177 209    Basename 10/04/11 0700 10/02/11 2255  NA 138 138  K 4.5 3.6  CL 102 99  CO2 29 30  BUN 25* 27*  CREATININE 1.20* 1.16*  GLUCOSE 118* 104*  CALCIUM 8.8 9.5    Basename 10/04/11 0700 10/03/11 0054  LABPT -- --  INR 1.15 0.99    Assessment/Plan: Pt stable hgb 8.5 mobilize with pt likely snf   Jannah Guardiola SCOTT 10/04/2011, 8:41 AM

## 2011-10-05 LAB — BASIC METABOLIC PANEL
CO2: 28 mEq/L (ref 19–32)
Calcium: 8.6 mg/dL (ref 8.4–10.5)
Chloride: 104 mEq/L (ref 96–112)
Glucose, Bld: 124 mg/dL — ABNORMAL HIGH (ref 70–99)
Sodium: 139 mEq/L (ref 135–145)

## 2011-10-05 LAB — CBC
Hemoglobin: 7.6 g/dL — ABNORMAL LOW (ref 12.0–15.0)
MCH: 28.7 pg (ref 26.0–34.0)
RBC: 2.65 MIL/uL — ABNORMAL LOW (ref 3.87–5.11)

## 2011-10-05 LAB — ABO/RH: ABO/RH(D): O POS

## 2011-10-05 LAB — PROTIME-INR: Prothrombin Time: 16.1 seconds — ABNORMAL HIGH (ref 11.6–15.2)

## 2011-10-05 MED ORDER — WARFARIN SODIUM 4 MG PO TABS
4.0000 mg | ORAL_TABLET | Freq: Once | ORAL | Status: AC
  Administered 2011-10-05: 4 mg via ORAL
  Filled 2011-10-05: qty 1

## 2011-10-05 NOTE — Progress Notes (Signed)
Subjective: No events overnight.   Objective:  Vital signs in last 24 hours:  Filed Vitals:   10/04/11 1400 10/04/11 2057 10/05/11 0615 10/05/11 1000  BP: 140/80 126/69 154/66 128/70  Pulse: 90 107 105   Temp: 98.8 F (37.1 C) 98.7 F (37.1 C) 99.2 F (37.3 C)   TempSrc: Oral Oral    Resp: 20 16 16    Height:      Weight:      SpO2: 95% 96% 90%     Intake/Output from previous day:   Intake/Output Summary (Last 24 hours) at 10/05/11 1223 Last data filed at 10/05/11 0900  Gross per 24 hour  Intake 2241.25 ml  Output    750 ml  Net 1491.25 ml    Physical Exam: General: Alert, awake, oriented to name only, in no acute distress. HEENT: No bruits, no goiter. Heart: Regular rate and rhythm, without murmurs, rubs, gallops. Lungs: Clear to auscultation bilaterally. Abdomen: Soft, nontender, nondistended, positive bowel sounds. Extremities: No clubbing cyanosis or edema with positive pedal pulses. Neuro: Grossly intact, nonfocal.   Lab Results:  Basic Metabolic Panel:    Component Value Date/Time   NA 139 10/05/2011 0510   K 4.0 10/05/2011 0510   CL 104 10/05/2011 0510   CO2 28 10/05/2011 0510   BUN 29* 10/05/2011 0510   CREATININE 1.13* 10/05/2011 0510   GLUCOSE 124* 10/05/2011 0510   CALCIUM 8.6 10/05/2011 0510   CBC:    Component Value Date/Time   WBC 9.7 10/05/2011 0510   HGB 7.6* 10/05/2011 0510   HCT 23.7* 10/05/2011 0510   PLT 150 10/05/2011 0510   MCV 89.4 10/05/2011 0510   NEUTROABS 8.3* 10/02/2011 2255   LYMPHSABS 1.1 10/02/2011 2255   MONOABS 0.6 10/02/2011 2255   EOSABS 0.1 10/02/2011 2255   BASOSABS 0.0 10/02/2011 2255    Recent Results (from the past 240 hour(s))  SURGICAL PCR SCREEN     Status: Normal   Collection Time   10/03/11  3:01 AM      Component Value Range Status Comment   MRSA, PCR NEGATIVE  NEGATIVE  Final    Staphylococcus aureus NEGATIVE  NEGATIVE  Final     Studies/Results: Dg Hip Complete Right Done In Or 15 By  Rmm  10/03/2011  *RADIOLOGY REPORT*  Clinical Data: Intertrochanteric fracture of the right hip.  RIGHT HIP - COMPLETE 2+ VIEW  Comparison: 10/02/2011.  Findings: Multiple fluoroscopic spot images demonstrate placement of an intermedullary rod and dynamic hip screw fixating the hip fracture with anatomic alignment.  IMPRESSION: Internal fixation with anatomic alignment.  Original Report Authenticated By: P. Loralie Champagne, M.D.   Dg C-arm 1-60 Min  10/03/2011  CLINICAL DATA: orif right hip fx   C-ARM 1-60 MINUTES  Fluoroscopy was utilized by the requesting physician.  No radiographic  interpretation.      Medications: Scheduled Meds:   . amLODipine  5 mg Oral Daily  . aspirin  81 mg Oral Daily  . buPROPion  200 mg Oral BID  . ceFAZolin (ANCEF) IV  1 g Intravenous Q8H  . donepezil  10 mg Oral QHS  . ferrous sulfate  325 mg Oral BID WC  . multivitamins ther. w/minerals  1 tablet Oral Daily  . oxybutynin  5 mg Oral BID  . venlafaxine  75 mg Oral Daily  . warfarin  2.5 mg Oral Once  . warfarin  4 mg Oral ONCE-1800  . warfarin   Does not apply Once  . DISCONTD:  LORazepam  1 mg Oral QHS   Continuous Infusions:   . sodium chloride    . sodium chloride 75 mL/hr at 10/05/11 0453   PRN Meds:.diphenhydrAMINE, HYDROcodone-acetaminophen, methocarbamol(ROBAXIN) IV, methocarbamol, metoCLOPramide (REGLAN) injection, metoCLOPramide, morphine, ondansetron (ZOFRAN) IV, ondansetron, oxyCODONE, senna-docusate, DISCONTD: diphenhydrAMINE, DISCONTD: HYDROcodone-acetaminophen, DISCONTD: morphine, DISCONTD: oxyCODONE, DISCONTD: zolpidem  Assessment/Plan:  Principal Problem:  *Femur fracture, right - per primary team  Active Problems:  Dementia - pt appears to be at her baseline per family members (daughter at bedside)   HTN (hypertension) - well controlled   ARF (acute renal failure) - creatinine trending down, continue to monitor strict I's and O's and obtain BMP in the AM   Anemia - Hg drop since  yesterday, will transfuse 1 units of PRBC's and obtain CBC in AM    LOS: 3 days   MAGICK-Yaneth Fairbairn 10/05/2011, 12:23 PM

## 2011-10-05 NOTE — Progress Notes (Signed)
Subjective: Has been oob to chair today - amb with pt   Objective: Vital signs in last 24 hours: Temp:  [98.7 F (37.1 C)-99.8 F (37.7 C)] 99.8 F (37.7 C) (11/11 1407) Pulse Rate:  [86-107] 86  (11/11 1407) Resp:  [16-20] 20  (11/11 1407) BP: (126-154)/(63-70) 132/63 mmHg (11/11 1407) SpO2:  [90 %-98 %] 98 % (11/11 1407)  Intake/Output from previous day: 11/10 0701 - 11/11 0700 In: 2126.3 [P.O.:250; I.V.:1876.3] Out: 750 [Urine:750] Intake/Output this shift: Total I/O In: 960 [P.O.:960] Out: 400 [Urine:400]  Exam:  Intact pulses distally Dorsiflexion/Plantar flexion intact  Labs:  Basename 10/05/11 0510 10/04/11 0700 10/02/11 2255  HGB 7.6* 8.5* 11.8*    Basename 10/05/11 0510 10/04/11 0700  WBC 9.7 9.3  RBC 2.65* 2.94*  HCT 23.7* 26.4*  PLT 150 177    Basename 10/05/11 0510 10/04/11 0700  NA 139 138  K 4.0 4.5  CL 104 102  CO2 28 29  BUN 29* 25*  CREATININE 1.13* 1.20*  GLUCOSE 124* 118*  CALCIUM 8.6 8.8    Basename 10/05/11 0510 10/04/11 0700  LABPT -- --  INR 1.26 1.15    Assessment/Plan: Mobilizing - plan snf mon/tues - home thereafter   DEAN,GREGORY SCOTT 10/05/2011, 5:29 PM

## 2011-10-05 NOTE — Progress Notes (Signed)
CSW spoke with pt's dtr, Florene Route, who gave permission to begin SNF search. CSW will f/u with offers.  Eunice Blase is employee of [employer] (HR in [place]) and can be reached during the week at [phone]. Dellie Burns, MSW, [place] [phone]

## 2011-10-05 NOTE — Progress Notes (Signed)
ANTICOAGULATION CONSULT NOTE - Initial Consult  Pharmacy Consult for coumadin Indication: VTE prophylaxis s/p hip compression  Allergies  Allergen Reactions  . Statins   . Sulfa Antibiotics     Patient Measurements: Height: 5\' 8"  (172.7 cm) Weight: 137 lb 11.2 oz (62.46 kg) IBW/kg (Calculated) : 63.9  Adjusted Body Weight:   Vital Signs: Temp: 99.2 F (37.3 C) (11/11 0615) BP: 154/66 mmHg (11/11 0615) Pulse Rate: 105  (11/11 0615)  Labs:  Basename 10/05/11 0510 10/04/11 0700 10/03/11 0054 10/02/11 2255  HGB 7.6* 8.5* -- --  HCT 23.7* 26.4* -- 36.0  PLT 150 177 -- 209  APTT -- -- 28 --  LABPROT 16.1* 14.9 13.3 --  INR 1.26 1.15 0.99 --  HEPARINUNFRC -- -- -- --  CREATININE 1.13* 1.20* -- 1.16*  CKTOTAL -- -- -- --  CKMB -- -- -- --  TROPONINI -- -- -- --   Estimated Creatinine Clearance: 33.3 ml/min (by C-G formula based on Cr of 1.13).  Medical History: Past Medical History  Diagnosis Date  . Dementia     Medications:  Scheduled:     . amLODipine  5 mg Oral Daily  . aspirin  81 mg Oral Daily  . buPROPion  200 mg Oral BID  . ceFAZolin (ANCEF) IV  1 g Intravenous Q8H  . donepezil  10 mg Oral QHS  . ferrous sulfate  325 mg Oral BID WC  . multivitamins ther. w/minerals  1 tablet Oral Daily  . oxybutynin  5 mg Oral BID  . venlafaxine  75 mg Oral Daily  . warfarin  2.5 mg Oral Once  . warfarin   Does not apply Once  . DISCONTD: LORazepam  1 mg Oral QHS    Assessment: 77 yo female s/p hip compression has been started on coumadin. INR 1.26<---1.15<---0.99. Coumadin score = 1 Goal of Therapy:  INR 2-3   Plan:  1) Coumadin 4 mg po x1; 2) daily PT/INR  Rebbie Lauricella, [place] R 10/05/2011,11:23 AM

## 2011-10-05 NOTE — Progress Notes (Signed)
CSW spoke with pt's spouse RE: ST SNF placement. Pt's husband requests SNF decision be made by pt's dtr, Gavin Pound. CSW left vmsg for Gavin Pound [phone]) requesting return call. Per RN, pt only oriented x1. CSW will pursue SNF once permission received from pt's dtr.  Dellie Burns, MSW, XBMWU [phone] (weekend cover)

## 2011-10-06 LAB — BASIC METABOLIC PANEL
CO2: 29 mEq/L (ref 19–32)
Chloride: 105 mEq/L (ref 96–112)
Glucose, Bld: 119 mg/dL — ABNORMAL HIGH (ref 70–99)
Potassium: 4 mEq/L (ref 3.5–5.1)
Sodium: 139 mEq/L (ref 135–145)

## 2011-10-06 LAB — TYPE AND SCREEN
ABO/RH(D): O POS
Antibody Screen: NEGATIVE

## 2011-10-06 LAB — CBC
HCT: 27.2 % — ABNORMAL LOW (ref 36.0–46.0)
MCV: 89.5 fL (ref 78.0–100.0)
RBC: 3.04 MIL/uL — ABNORMAL LOW (ref 3.87–5.11)
WBC: 10.9 10*3/uL — ABNORMAL HIGH (ref 4.0–10.5)

## 2011-10-06 MED ORDER — WARFARIN SODIUM 2.5 MG PO TABS
2.5000 mg | ORAL_TABLET | Freq: Once | ORAL | Status: AC
  Administered 2011-10-06: 2.5 mg via ORAL
  Filled 2011-10-06: qty 1

## 2011-10-06 MED ORDER — INFLUENZA VIRUS VACC SPLIT PF IM SUSP
0.5000 mL | Freq: Once | INTRAMUSCULAR | Status: AC
  Administered 2011-10-06: 0.5 mL via INTRAMUSCULAR
  Filled 2011-10-06: qty 0.5

## 2011-10-06 NOTE — Discharge Summary (Signed)
Vickie Hoffman, Vickie Hoffman NO.:  [id number]  MEDICAL RECORD NO.:  [id number]  LOCATION:  5039                         FACILITY:  [facility]  PHYSICIAN:  Vanita Panda. Magnus Ivan, M.D.DATE OF BIRTH:  09/08/1920  DATE OF ADMISSION:  10/02/2011 DATE OF DISCHARGE:  10/07/2011                              DISCHARGE SUMMARY   PRIMARY ADMITTING DIAGNOSIS:  Right intertrochanteric femur/hip fracture.  ASSOCIATED ACTIVE MEDICAL PROBLEMS: 1. Coumadin. 2. Hypertension. 3. Acute on chronic renal failure. 4. Anemia.  DISCHARGE DIAGNOSIS:  Status post open reduction and internal fixation of right intertrochanteric femur/hip fracture.  PROCEDURE:  Open reduction and internal fixation of right intertrochanteric femur fracture on October 03, 2011.  HOSPITAL COURSE:  [name] is an 77 year old female with dementia, who lives at home with her elderly husband.  On the day of admission, she had had a mechanical fall and was brought to the emergency room at [facility] after the inability to ambulate.  Orthopedic Surgery was consulted and I saw the patient and recommended surgery.  An Internal Medicine consultation was also obtained and she was cleared for surgery. The family understands the risks and benefits of this and she was taken to the operating room on the day of admission where she underwent an open reduction and internal fixation of her right hip through 2 small incisions using intramedullary hip screw.  She remained as inpatient on the orthopedic floor and it was definitely felt by therapy and the family that she would benefit from skilled nursing center placement.  By the day of discharge, she was afebrile with stable vital signs and her acute blood loss anemia had stabilized to a hemoglobin of 8.9.  We felt that she could be discharged safely to the skilled nursing facility, but continued attempt at weightbearing as tolerated on the right lower extremity.  DISPOSITION:   Discharged to skilled nursing facility.  DISCHARGE INSTRUCTIONS:  While he is at the skilled nursing facility, her 2 small incisions cannot get wet.  A dry dressing should be applied to these incisions daily.  A followup appointment should be established in 2 weeks at [facility] with Dr. Doneen Poisson at [phone].  Attempts should be made to allow her to weight bear as tolerated with working on balance, gait training, and coordination with assistance.  DISCHARGE MEDICATIONS: 1. Norco 5/325 one p.o. q.4-6 h. p.r.n. pain. 2. Norvasc 5 mg p.o. daily. 3. Aspirin 81 mg p.o. daily. 4. Bupropion 200 mg p.o. b.i.d. 5. Donepezil 10 mg p.o. at bedtime. 6. Multivitamin 1 tablet p.o. daily. 7. Oxybutynin 5 mg p.o. b.i.d. 8. Venlafaxine 75 mg p.o. daily.     Vanita Panda. Magnus Ivan, M.D.     CYB/MEDQ  D:  10/06/2011  T:  10/06/2011  Job:  [id number]

## 2011-10-06 NOTE — Progress Notes (Signed)
Subjective: No acute changes.  Refused lab draw this am.  Does have acute blood loss anemia.   Objective: Vital signs in last 24 hours: Temp:  [98 F (36.7 C)-99.9 F (37.7 C)] 98.7 F (37.1 C) (11/11 2255) Pulse Rate:  [80-105] 103  (11/11 2255) Resp:  [16-20] 18  (11/11 2255) BP: (119-138)/(50-70) 133/64 mmHg (11/11 2255) SpO2:  [98 %] 98 % (11/11 1407)  Intake/Output from previous day: 11/11 0701 - 11/12 0700 In: 1310 [P.O.:960; Blood:350] Out: 400 [Urine:400] Intake/Output this shift: Total I/O In: 350 [Blood:350] Out: -    Basename 10/05/11 0510 10/04/11 0700  HGB 7.6* 8.5*    Basename 10/05/11 0510 10/04/11 0700  WBC 9.7 9.3  RBC 2.65* 2.94*  HCT 23.7* 26.4*  PLT 150 177    Basename 10/05/11 0510 10/04/11 0700  NA 139 138  K 4.0 4.5  CL 104 102  CO2 28 29  BUN 29* 25*  CREATININE 1.13* 1.20*  GLUCOSE 124* 118*  CALCIUM 8.6 8.8    Basename 10/05/11 0510 10/04/11 0700  LABPT -- --  INR 1.26 1.15    Neurologically intact Intact pulses distally Incision: scant drainage  Assessment/Plan: 1) will need SNF placement 2) attemp blood draw again today to check CBC.   Vickie Hoffman Y 10/06/2011, 6:59 AM

## 2011-10-06 NOTE — Progress Notes (Signed)
Physical Therapy Treatment Patient Details Name: Vickie Hoffman MRN: [id number] DOB: 02/28/1920 Today's Date: 10/06/2011  PT Assessment/Plan  PT - Assessment/Plan Comments on Treatment Session: Pt. with less pain today but still limited in participation PT Plan: Discharge plan remains appropriate PT Frequency: Min 5X/week Follow Up Recommendations: Skilled nursing facility Equipment Recommended: Defer to next venue PT Goals  Acute Rehab PT Goals PT Goal: Supine/Side to Sit - Progress: Progressing toward goal PT Transfer Goal: Sit to Stand/Stand to Sit - Progress: Progressing toward goal PT Transfer Goal: Bed to Chair/Chair to Bed - Progress: Progressing toward goal  PT Treatment Precautions/Restrictions  Precautions Precautions: Fall Required Braces or Orthoses: No Restrictions Weight Bearing Restrictions: Yes RLE Weight Bearing: Weight bearing as tolerated Mobility (including Balance) Bed Mobility Bed Mobility: Yes Supine to Sit: 2: Max assist Supine to Sit Details (indicate cue type and reason): A required for all aspects of transfer as patient only able to assist slightly with moving LEs to EOB Sitting - Scoot to Edge of Bed: 2: Max assist Sitting - Scoot to [place] of Bed Details (indicate cue type and reason): Required use of chuck pad to scoot to EOB.  Transfers Transfers: Yes Sit to Stand: 1: +2 Total assist;Patient percentage (comment);From bed;Other (comment) (30%) Sit to Stand Details (indicate cue type and reason): A to block LE and stand upright to change diaper Stand to Sit: 1: +2 Total assist;Patient percentage (comment);To chair/3-in-1;To bed (30%) Stand to Sit Details: Cues/A for positioning with sitting.  Stand Pivot Transfers: 1: +2 Total assist (patient = 20%) Stand Pivot Transfer Details (indicate cue type and reason): A to shift hips towards recliner and to maintain posture and positioning.   Static Sitting Balance Static Sitting - Balance Support:  Bilateral upper extremity supported;Feet supported Static Sitting - Level of Assistance: 4: Min assist Static Sitting - Comment/# of Minutes: Pt required cuing for upright posture and to use UEs. Pt sat EOB approx 10 minutes. Pt with lateral/posterior lean Exercise    End of Session PT - End of Session Equipment Utilized During Treatment: Gait belt Activity Tolerance: Treatment limited secondary to medical complications (Comment) Patient left: in chair;with call bell in reach;with family/visitor present Nurse Communication: Mobility status for transfers General Behavior During Session: [facility] for tasks performed Cognition: Impaired, at baseline Cognitive Impairment: Significant dementia  Kannon Baum, Adline Potter 10/06/2011, 1:13 PM 10/06/2011 Fredrich Birks PTA [phone] pager [phone] office

## 2011-10-06 NOTE — Progress Notes (Signed)
Utilization review completed. Grabiel Schmutz, RN, BSN. 10/06/11  

## 2011-10-06 NOTE — Progress Notes (Signed)
CSW met with pt and pt's daughter in law, Eunice Blase. CSW provided bed offers. CSW will continue to follow to facilitate pt's discharge.   Dede Query, MSW, Theresia Majors  [phone]

## 2011-10-06 NOTE — Discharge Summary (Signed)
  Priority d/c summary dictated 10/06/11 at approx 9:45pm #[id number]

## 2011-10-06 NOTE — Progress Notes (Signed)
ANTICOAGULATION CONSULT NOTE - Follow Up Consult  Pharmacy Consult for coumadin Indication: VTE prophylaxis  Allergies  Allergen Reactions  . Statins   . Sulfa Antibiotics     Patient Measurements: Height: 5\' 8"  (172.7 cm) Weight: 137 lb 11.2 oz (62.46 kg) IBW/kg (Calculated) : 63.9   Labs:  Basename 10/06/11 0917 10/06/11 0916 10/05/11 0510 10/04/11 0700  HGB 8.9* -- 7.6* --  HCT 27.2* -- 23.7* 26.4*  PLT 163 -- 150 177  APTT -- -- -- --  LABPROT -- 17.8* 16.1* 14.9  INR -- 1.44 1.26 1.15  HEPARINUNFRC -- -- -- --  CREATININE -- 0.91 1.13* 1.20*  CKTOTAL -- -- -- --  CKMB -- -- -- --  TROPONINI -- -- -- --   Estimated Creatinine Clearance: 41.4 ml/min (by C-G formula based on Cr of 0.91).  Assessment: 77 year old patient for vte prophylaxis.  INR has risen to 1.44 and hgb and hct are dropping. ?blood loss to surgery?.  Goal of Therapy:  INR 2-3   Plan:  Coumadin 2.5mg  at 1800 today. Recheck INR tomorrow to adjust next dose. Watch CBC results closely. Begin patient education.    Walden Field B 10/06/2011,2:19 PM

## 2011-10-06 NOTE — Progress Notes (Signed)
Subjective: Patient without complaints. Nurse reports that the patient has had no events overnight. She initially refused medications and blood draws this morning but subsequently was cooperative with him. The patient appears to be presently confused which per the nurse is her baseline. The patient has had no evidence of bleeding. Objective: Filed Vitals:   10/05/11 2015 10/05/11 2115 10/05/11 2215 10/05/11 2255  BP: 127/62 138/62 125/50 133/64  Pulse: 103 80 95 103  Temp: 98.6 F (37 C) 99.9 F (37.7 C) 99.3 F (37.4 C) 98.7 F (37.1 C)  TempSrc: Oral Oral Oral Oral  Resp: 18 16 16 18   Height:      Weight:      SpO2:       Weight change:   Intake/Output Summary (Last 24 hours) at 10/06/11 1244 Last data filed at 10/06/11 1000  Gross per 24 hour  Intake   3340 ml  Output      0 ml  Net   3340 ml    General: Alert, awake, oriented x3, in no acute distress.  HEENT: [place]/AT PEERL, EOMI Neck: Trachea midline,  no masses, no thyromegal,y no JVD, no carotid bruit OROPHARYNX:  Moist, No exudate/ erythema/lesions.  Heart: Regular rate and rhythm, without murmurs, rubs, gallops, PMI non-displaced, no heaves or thrills on palpation.  Lungs: Clear to auscultation, no wheezing or rhonchi noted. No increased vocal fremitus resonant to percussion  Abdomen: Soft, nontender, nondistended, positive bowel sounds, no masses no hepatosplenomegaly noted..  Neuro: No focal neurological deficits noted cranial nerves II through XII grossly intact. DTRs 2+ bilaterally upper and lower extremities. Strength 5 out of 5 in bilateral upper and lower extremities. Musculoskeletal: No warm swelling or erythema around joints, no spinal tenderness noted. Psychiatric: Patient alert and oriented x3, good insight and cognition, good recent to remote recall. Lymph node survey: No cervical axillary or inguinal lymphadenopathy noted.     Lab Results:  [facility] 10/06/11 0916 10/05/11 0510  NA 139 139  K 4.0 4.0   CL 105 104  CO2 29 28  GLUCOSE 119* 124*  BUN 23 29*  CREATININE 0.91 1.13*  CALCIUM 8.6 8.6  MG -- --  PHOS -- --   No results found for this basename: AST:2,ALT:2,ALKPHOS:2,BILITOT:2,PROT:2,ALBUMIN:2 in the last 72 hours No results found for this basename: LIPASE:2,AMYLASE:2 in the last 72 hours  Basename 10/06/11 0917 10/05/11 0510  WBC 10.9* 9.7  NEUTROABS -- --  HGB 8.9* 7.6*  HCT 27.2* 23.7*  MCV 89.5 89.4  PLT 163 150   No results found for this basename: CKTOTAL:3,CKMB:3,CKMBINDEX:3,TROPONINI:3 in the last 72 hours No results found for this basename: POCBNP:3 in the last 72 hours No results found for this basename: DDIMER:2 in the last 72 hours No results found for this basename: HGBA1C:2 in the last 72 hours No results found for this basename: CHOL:2,HDL:2,LDLCALC:2,TRIG:2,CHOLHDL:2,LDLDIRECT:2 in the last 72 hours  Basename 10/03/11 1532  TSH 0.712  T4TOTAL --  T3FREE --  THYROIDAB --    Basename 10/03/11 1532  VITAMINB12 1892*  FOLATE 16.5  FERRITIN 89  TIBC 228*  IRON 30*  RETICCTPCT 1.1    Micro Results: Recent Results (from the past 240 hour(s))  SURGICAL PCR SCREEN     Status: Normal   Collection Time   10/03/11  3:01 AM      Component Value Range Status Comment   MRSA, PCR NEGATIVE  NEGATIVE  Final    Staphylococcus aureus NEGATIVE  NEGATIVE  Final     Studies/Results:  Dg Lumbar Spine Complete  10/03/2011  *RADIOLOGY REPORT*  Clinical Data: Fall  LUMBAR SPINE - COMPLETE 4+ VIEW  Comparison: None.  Findings: Anatomic alignment on the lateral view.  Mild levoscoliosis with the apex at L3-4.  No vertebral body height loss.  Severe multilevel this space narrowing and osteophyte formation.  Osteopenia.  IMPRESSION: No acute bony pathology.  Chronic change.  Original Report Authenticated By: Donavan Burnet, M.D.   Dg Hip Complete Right Done In Or 15 By Rmm  10/03/2011  *RADIOLOGY REPORT*  Clinical Data: Intertrochanteric fracture of the right hip.   RIGHT HIP - COMPLETE 2+ VIEW  Comparison: 10/02/2011.  Findings: Multiple fluoroscopic spot images demonstrate placement of an intermedullary rod and dynamic hip screw fixating the hip fracture with anatomic alignment.  IMPRESSION: Internal fixation with anatomic alignment.  Original Report Authenticated By: P. Loralie Champagne, M.D.   Dg Hip Complete Right  10/03/2011  *RADIOLOGY REPORT*  Clinical Data: Fall  RIGHT HIP - COMPLETE 2+ VIEW  Comparison: None.  Findings: Intertrochanteric right femur fracture is present with varus deformity.  Osteopenia.  Degenerative changes in the spine.  IMPRESSION: Intertrochanteric right femur fracture. Per CMS PQRS reporting requirements (PQRS Measure 24): Given the patient's age of greater than 50 and the fracture site (hip, distal radius, or spine), the patient should be tested for osteoporosis using DXA, and the appropriate treatment considered based on the DXA results.  Original Report Authenticated By: Donavan Burnet, M.D.   Dg Chest Portable 1 View  10/03/2011  *RADIOLOGY REPORT*  Clinical Data: Preoperative radiograph, hip fracture.  PORTABLE CHEST - 1 VIEW  Comparison: 10/29/2005 abdominal CT.  Findings: Prominent mediastinal contours may reflect aortic tortuosity however thoracic aortic aneurysm not excluded.  Heart size mildly enlarged.  Interstitial prominence.  Linear left lung base opacity likely reflects scarring or atelectasis.  Osteopenia. No definite acute osseous abnormality.  Small hiatal hernia.  IMPRESSION: Cardiomegaly and prominent mediastinal contours.  May reflect aortic tortuosity however aneurysmal dilatation not excluded.  Interstitial prominence.  Linear left lung base opacity likely reflects scarring or atelectasis.  Original Report Authenticated By: Waneta Martins, M.D.   Dg C-arm 1-60 Min  10/03/2011  CLINICAL DATA: orif right hip fx   C-ARM 1-60 MINUTES  Fluoroscopy was utilized by the requesting physician.  No radiographic   interpretation.      Medications: I have reviewed the patient's current medications. Scheduled Meds:   . amLODipine  5 mg Oral Daily  . aspirin  81 mg Oral Daily  . buPROPion  200 mg Oral BID  . donepezil  10 mg Oral QHS  . ferrous sulfate  325 mg Oral BID WC  . influenza  inactive virus vaccine  0.5 mL Intramuscular Once  . multivitamins ther. w/minerals  1 tablet Oral Daily  . oxybutynin  5 mg Oral BID  . venlafaxine  75 mg Oral Daily  . warfarin  4 mg Oral ONCE-1800  . warfarin   Does not apply Once   Continuous Infusions:   . sodium chloride    . sodium chloride 75 mL/hr at 10/06/11 0700   PRN Meds:.diphenhydrAMINE, HYDROcodone-acetaminophen, metoCLOPramide (REGLAN) injection, metoCLOPramide, morphine, ondansetron (ZOFRAN) IV, ondansetron, oxyCODONE, senna-docusate Assessment/Plan: Patient Active Hospital Problem List: HTN (hypertension) (10/03/2011)   Assessment: Blood pressure well controlled     Anemia (10/03/2011)   Assessment: Patient status post transfusion 1 unit packed blood cells. Hemoglobin presently 8.3 no evidence of bleeding we'll monitor for one more day if continues  to be stable will likely sign off.      LOS: 4 days

## 2011-10-07 ENCOUNTER — Other Ambulatory Visit: Payer: Self-pay

## 2011-10-07 LAB — CBC
HCT: 29.1 % — ABNORMAL LOW (ref 36.0–46.0)
Hemoglobin: 9.6 g/dL — ABNORMAL LOW (ref 12.0–15.0)
MCH: 29.7 pg (ref 26.0–34.0)
MCHC: 33 g/dL (ref 30.0–36.0)
MCV: 90.1 fL (ref 78.0–100.0)
Platelets: 230 10*3/uL (ref 150–400)
RBC: 3.23 MIL/uL — ABNORMAL LOW (ref 3.87–5.11)
RDW: 14.9 % (ref 11.5–15.5)
WBC: 11.2 10*3/uL — ABNORMAL HIGH (ref 4.0–10.5)

## 2011-10-07 LAB — BASIC METABOLIC PANEL
CO2: 30 mEq/L (ref 19–32)
Chloride: 104 mEq/L (ref 96–112)
Creatinine, Ser: 0.79 mg/dL (ref 0.50–1.10)
GFR calc Af Amer: 83 mL/min — ABNORMAL LOW (ref >90)
Potassium: 3.7 mEq/L (ref 3.5–5.1)

## 2011-10-07 LAB — CARDIAC PANEL(CRET KIN+CKTOT+MB+TROPI)
CK, MB: 2.4 ng/mL (ref 0.3–4.0)
Relative Index: INVALID (ref 0.0–2.5)
Total CK: 63 U/L (ref 7–177)
Troponin I: <0.3 ng/mL (ref <0.30)

## 2011-10-07 LAB — PROTIME-INR: INR: 1.71 — ABNORMAL HIGH (ref 0.00–1.49)

## 2011-10-07 LAB — AMMONIA: Ammonia: 14 umol/L (ref 11–60)

## 2011-10-07 MED ORDER — WARFARIN SODIUM 2.5 MG PO TABS
2.5000 mg | ORAL_TABLET | Freq: Once | ORAL | Status: AC
  Administered 2011-10-07: 2.5 mg via ORAL
  Filled 2011-10-07 (×2): qty 1

## 2011-10-07 MED ORDER — NALOXONE HCL 0.4 MG/ML IJ SOLN
INTRAMUSCULAR | Status: AC
  Filled 2011-10-07: qty 1

## 2011-10-07 NOTE — Progress Notes (Signed)
Pt's discharge to SNF was delayed due to observation.Pt's daughter in law is aware. SNF is aware and will be able to accept pt when medically stable. Pt and pt's daughter is agreeable to discharge plan. CSW will continue to follow to facilitate discharge to [facility].   Dede Query, MSW, Theresia Majors [phone]

## 2011-10-07 NOTE — Progress Notes (Signed)
Asked to assist with patient with decreased LOC and decreased resps.  Patient had 10mg  oxycodone this AM around 0630.  RN reports patient awake and ate breakfast this AM.  Now resting in bed - w/d - responds only to DPS - on face mask O2  - resps 10 shallow  B/p stable HR 120's - Narcan 0.4mg  IV ordered by MD - administered by Waynetta Sandy - RN - patient quickly awake  - talking b/p 180/70 HR 103 - RR 16 O2 sat 100% - patient w/d - quickly at neuro baseline - oriented to person - 1140 160/80 RR 20 HR 103 - repeat 12 lead ST.  Dr. Ashley Royalty present - patient to transfer to tele floor.  Remained with patient until 1240 - remains alert - weaned to 2 liter nasal cannula  With O2 sats 96%.  Bil breath sounds present and clear.  Patient stable.  Awaiting transfer to tele floor.  Call as needed.

## 2011-10-07 NOTE — Progress Notes (Signed)
10/07/11 1200 Vance Peper, RN BSN Case Manager [phone] Patient currently in A-fib, being transferred to Step-down unit.

## 2011-10-07 NOTE — Discharge Summary (Signed)
NAMEAOLANIS, CRISPEN NO.:  [id number]  MEDICAL RECORD NO.:  [id number]  LOCATION:  5039                         FACILITY:  [facility]  PHYSICIAN:  Vanita Panda. Magnus Ivan, M.D.DATE OF BIRTH:  [birth year]  DATE OF ADMISSION:  10/02/2011 DATE OF DISCHARGE:  10/08/2011                              DISCHARGE SUMMARY   ADDENDUM:  HOSPITAL COURSE:  We decided to keep Ms. Luedke 1 additional day.  In the early morning hours, she had some confusion or somnolence on top of her dementia.  She was given pain medication and this may have caused that.  They gave her Narcan and she did respond to this up, but the nurses felt that she may had a change in her vital signs and EKG was obtained and first it showed atrial fibrillation with rapid ventricular response, then on a repeat check it appeared that she just had some PVCs.  She was evaluated by the medicine physicians who were consulted on her and following her already.  They recommend she stay just 1 additional day on the telemetry bed to make sure things were fine.  By the next day, she continued to do well and it was felt she could be discharged safely to the skilled nursing facility.  The previous dictation that have our instructions and orders and followup information can still be used for her discharge.     Vanita Panda. Magnus Ivan, M.D.     CYB/MEDQ  D:  10/07/2011  T:  10/07/2011  Job:  [id number]

## 2011-10-07 NOTE — Progress Notes (Signed)
Subjective: Called by bedside nurse to see patient secondary to lethargy and elevated heart rate. Nurse also reports the patient's oxygen saturations had decreased. The nurse also reports that the patient received oxycodone overnight. Arrived on the floor to see patient the patient was significantly lethargic unable to respond well or has questions. The patient at that time was on 50% oxygen with O2 saturations 100%. Blood pressure at present the 180/74, heart rate 113, respiratory rate 10. Order for 0.4 of Narcan given and administered. The patient had a dramatic response to Narcan awakening respiratory rate increasing to 14 patient is now alert and oriented to person which is her baseline. Objective: Filed Vitals:   10/05/11 2255 10/06/11 1400 10/06/11 2200 10/07/11 0515  BP:  149/68 136/86 160/77  Pulse:  112 79 112  Temp:  98.7 F (37.1 C) 97.8 F (36.6 C) 99.1 F (37.3 C)  TempSrc: Oral  Oral   Resp: 18 20 16 16   Height:      Weight:      SpO2:  97% 100% 94%   Weight change:   Intake/Output Summary (Last 24 hours) at 10/07/11 1159 Last data filed at 10/07/11 0700  Gross per 24 hour  Intake    600 ml  Output      1 ml  Net    599 ml    General: The patient anitially very lethargic and unable to respond even to sternal rub. After Narcan was given the patient is now Alert, awake, oriented to person and in no acute distress.  HEENT: [place]/AT PEER[place], EOMI Neck: Trachea midline,  no masses, no thyromegal,y no JVD, no carotid bruit OROPHARYNX:  Moist, No exudate/ erythema/lesions.  Heart: Regular rate and rhythm, without murmurs, rubs, gallops, PMI non-displaced, no heaves or thrills on palpation.  Lungs: Clear to auscultation, no wheezing or rhonchi noted. No increased vocal fremitus resonant to percussion  Abdomen: Soft, nontender, nondistended, positive bowel sounds, no masses no hepatosplenomegaly noted..  Neuro: No focal neurological deficits noted cranial nerves II through XII  grossly intact. DTRs 2+ bilaterally upper and lower extremities. Strength 5 out of 5 in bilateral upper and lower extremities. Musculoskeletal: No warm swelling or erythema around joints, no spinal tenderness noted. Psychiatric: Patient alert and oriented x3, good insight and cognition, good recent to remote recall. Lymph node survey: No cervical axillary or inguinal lymphadenopathy noted.     Lab Results:  [facility] 10/06/11 0916 10/05/11 0510  NA 139 139  K 4.0 4.0  CL 105 104  CO2 29 28  GLUCOSE 119* 124*  BUN 23 29*  CREATININE 0.91 1.13*  CALCIUM 8.6 8.6  MG -- --  PHOS -- --   No results found for this basename: AST:2,ALT:2,ALKPHOS:2,BILITOT:2,PROT:2,ALBUMIN:2 in the last 72 hours No results found for this basename: LIPASE:2,AMYLASE:2 in the last 72 hours  Basename 10/06/11 0917 10/05/11 0510  WBC 10.9* 9.7  NEUTROABS -- --  HGB 8.9* 7.6*  HCT 27.2* 23.7*  MCV 89.5 89.4  PLT 163 150   No results found for this basename: CKTOTAL:3,CKMB:3,CKMBINDEX:3,TROPONINI:3 in the last 72 hours No results found for this basename: POCBNP:3 in the last 72 hours No results found for this basename: DDIMER:2 in the last 72 hours No results found for this basename: HGBA1C:2 in the last 72 hours No results found for this basename: CHOL:2,HDL:2,LDLCALC:2,TRIG:2,CHOLHDL:2,LDLDIRECT:2 in the last 72 hours No results found for this basename: TSH,T4TOTAL,FREET3,T3FREE,THYROIDAB in the last 72 hours No results found for this basename: VITAMINB12:2,FOLATE:2,FERRITIN:2,TIBC:2,IRON:2,RETICCTPCT:2 in the last 72  hours  Micro Results: Recent Results (from the past 240 hour(s))  SURGICAL PCR SCREEN     Status: Normal   Collection Time   10/03/11  3:01 AM      Component Value Range Status Comment   MRSA, PCR NEGATIVE  NEGATIVE  Final    Staphylococcus aureus NEGATIVE  NEGATIVE  Final     Studies/Results: Dg Lumbar Spine Complete  10/03/2011  *RADIOLOGY REPORT*  Clinical Data: Fall  LUMBAR  SPINE - COMPLETE 4+ VIEW  Comparison: None.  Findings: Anatomic alignment on the lateral view.  Mild levoscoliosis with the apex at L3-4.  No vertebral body height loss.  Severe multilevel this space narrowing and osteophyte formation.  Osteopenia.  IMPRESSION: No acute bony pathology.  Chronic change.  Original Report Authenticated By: Donavan Burnet, M.D.   Dg Hip Complete Right Done In Or 15 By Rmm  10/03/2011  *RADIOLOGY REPORT*  Clinical Data: Intertrochanteric fracture of the right hip.  RIGHT HIP - COMPLETE 2+ VIEW  Comparison: 10/02/2011.  Findings: Multiple fluoroscopic spot images demonstrate placement of an intermedullary rod and dynamic hip screw fixating the hip fracture with anatomic alignment.  IMPRESSION: Internal fixation with anatomic alignment.  Original Report Authenticated By: P. Loralie Champagne, M.D.   Dg Hip Complete Right  10/03/2011  *RADIOLOGY REPORT*  Clinical Data: Fall  RIGHT HIP - COMPLETE 2+ VIEW  Comparison: None.  Findings: Intertrochanteric right femur fracture is present with varus deformity.  Osteopenia.  Degenerative changes in the spine.  IMPRESSION: Intertrochanteric right femur fracture. Per CMS PQRS reporting requirements (PQRS Measure 24): Given the patient's age of greater than 50 and the fracture site (hip, distal radius, or spine), the patient should be tested for osteoporosis using DXA, and the appropriate treatment considered based on the DXA results.  Original Report Authenticated By: Donavan Burnet, M.D.   Dg Chest Portable 1 View  10/03/2011  *RADIOLOGY REPORT*  Clinical Data: Preoperative radiograph, hip fracture.  PORTABLE CHEST - 1 VIEW  Comparison: 10/29/2005 abdominal CT.  Findings: Prominent mediastinal contours may reflect aortic tortuosity however thoracic aortic aneurysm not excluded.  Heart size mildly enlarged.  Interstitial prominence.  Linear left lung base opacity likely reflects scarring or atelectasis.  Osteopenia. No definite acute osseous  abnormality.  Small hiatal hernia.  IMPRESSION: Cardiomegaly and prominent mediastinal contours.  May reflect aortic tortuosity however aneurysmal dilatation not excluded.  Interstitial prominence.  Linear left lung base opacity likely reflects scarring or atelectasis.  Original Report Authenticated By: Waneta Martins, M.D.   Dg C-arm 1-60 Min  10/03/2011  CLINICAL DATA: orif right hip fx   C-ARM 1-60 MINUTES  Fluoroscopy was utilized by the requesting physician.  No radiographic  interpretation.      Medications: I have reviewed the patient's current medications. Scheduled Meds:   . amLODipine  5 mg Oral Daily  . aspirin  81 mg Oral Daily  . buPROPion  200 mg Oral BID  . donepezil  10 mg Oral QHS  . ferrous sulfate  325 mg Oral BID WC  . influenza  inactive virus vaccine  0.5 mL Intramuscular Once  . multivitamins ther. w/minerals  1 tablet Oral Daily  . naloxone      . oxybutynin  5 mg Oral BID  . venlafaxine  75 mg Oral Daily  . warfarin  2.5 mg Oral ONCE-1800  . warfarin   Does not apply Once   Continuous Infusions:   . sodium chloride    .  sodium chloride 75 mL/hr (10/06/11 1330)   PRN Meds:.diphenhydrAMINE, HYDROcodone-acetaminophen, metoCLOPramide (REGLAN) injection, metoCLOPramide, morphine, ondansetron (ZOFRAN) IV, ondansetron, senna-docusate, DISCONTD: oxyCODONE Assessment/Plan:   Patient Active Hospital Problem List: Narcotic intolerance: The patient received narcotic medications for pain overnight and it appears that she's had a hyper sensitive response this. The patient was given Narcan and had a dramatic response improving her general mental status and her breathing. On my initial assessment of the patient secondary to her breathing and her response I ordered an ABG. However the patient is now clinically at her baseline do not feel that there is a need to pursue the ABG on the patient is also improved her O2 saturations and she is 93% on room air. I have discontinued  her narcotic pain medication and would recommend this patient not be given any narcotics for pain to be treated just with Tylenol.   Sinus tachycardia with a known left bundle branch block: This patient has a sinus tachycardia. This is likely secondary to her state to anemia with decreased mental status superimposed. Presently on recommend this patient be observed on telemetry for at least 24 hours and that her hemoglobin be checked on the day to ensure that she's not continuing to decrease her hematocrit volume.  Dementia (10/03/2011)   Assessment: Patient is at her baseline for dementia.     HTN (hypertension) (10/03/2011)   Assessment: Patient appears to be on adequate medications for her hypertension. None of the patient is awake a mass in her to go ahead and administer her dailt antihypertensive medications    ARF (acute renal failure) (10/03/2011)   Assessment: Resolved     Anemia (10/03/2011)   Assessment: Hemoglobin pending.   recommend transfer to telemetry bed.   LOS: 5 days

## 2011-10-07 NOTE — Progress Notes (Signed)
pts BP 180/60 manually, HR 109; pt with no complaints; Dr Ophelia Charter paged and made aware, no new orders received;told to try and give norvasc from earlier today; will continue to monitor

## 2011-10-07 NOTE — Discharge Summary (Signed)
Physician Discharge Summary  Patient ID: RHONNA HOLSTER MRN: [id number] DOB/AGE: Sep 05, 1920 77 y.o.  Admit date: 10/02/2011 Discharge date:   Admission Diagnoses:  Principal Problem:  *Femur fracture, right Active Problems:  Dementia  HTN (hypertension)  ARF (acute renal failure)  Anemia   Discharge Diagnoses:  Same  Past Medical History  Diagnosis Date  . Dementia     Surgeries: Procedure(s): COMPRESSION HIP on 10/02/2011 - 10/03/2011   Consultants: Treatment Team:  Clarisse Gouge, MD Lawrence Marseilles. Ashley Royalty  Discharged Condition: Improved  Hospital Course: JAYANNA KROEGER is an 77 y.o. female who was admitted 10/02/2011 with a chief complaint of  Chief Complaint  Patient presents with  . Fall  , and found to have a diagnosis of Femur fracture, right.  They were brought to the operating room on 10/02/2011 - 10/03/2011 and underwent the above named procedures.    They were given perioperative antibiotics:  Anti-infectives     Start     Dose/Rate Route Frequency Ordered Stop   10/04/11 0130   ceFAZolin (ANCEF) IVPB 1 g/50 mL premix        1 g 100 mL/hr over 30 Minutes Intravenous 3 times per day 10/04/11 0035 10/04/11 1557        .  They were given sequential compression devices, early ambulation, and chemoprophylaxis for DVT prophylaxis.  They benefited maximally from their hospital stay and there were no complications.    Recent vital signs:  Filed Vitals:   10/06/11 2200  BP: 136/86  Pulse: 79  Temp: 97.8 F (36.6 C)  Resp: 16    Recent laboratory studies:  Results for orders placed during the hospital encounter of 10/02/11  CBC      Component Value Range   WBC 10.1  4.0 - 10.5 (K/uL)   RBC 4.11  3.87 - 5.11 (MIL/uL)   Hemoglobin 11.8 (*) 12.0 - 15.0 (g/dL)   HCT 60.1  09.3 - 23.5 (%)   MCV 87.6  78.0 - 100.0 (fL)   MCH 28.7  26.0 - 34.0 (pg)   MCHC 32.8  30.0 - 36.0 (g/dL)   RDW 57.3  22.0 - 25.4 (%)   Platelets 209  150 - 400 (K/uL)    DIFFERENTIAL      Component Value Range   Neutrophils Relative 82 (*) 43 - 77 (%)   Neutro Abs 8.3 (*) 1.7 - 7.7 (K/uL)   Lymphocytes Relative 11 (*) 12 - 46 (%)   Lymphs Abs 1.1  0.7 - 4.0 (K/uL)   Monocytes Relative 6  3 - 12 (%)   Monocytes Absolute 0.6  0.1 - 1.0 (K/uL)   Eosinophils Relative 1  0 - 5 (%)   Eosinophils Absolute 0.1  0.0 - 0.7 (K/uL)   Basophils Relative 0  0 - 1 (%)   Basophils Absolute 0.0  0.0 - 0.1 (K/uL)  COMPREHENSIVE METABOLIC PANEL      Component Value Range   Sodium 138  135 - 145 (mEq/L)   Potassium 3.6  3.5 - 5.1 (mEq/L)   Chloride 99  96 - 112 (mEq/L)   CO2 30  19 - 32 (mEq/L)   Glucose, Bld 104 (*) 70 - 99 (mg/dL)   BUN 27 (*) 6 - 23 (mg/dL)   Creatinine, Ser 2.70 (*) 0.50 - 1.10 (mg/dL)   Calcium 9.5  8.4 - 62.3 (mg/dL)   Total Protein 6.7  6.0 - 8.3 (g/dL)   Albumin 3.4 (*) 3.5 - 5.2 (g/dL)  AST 15  0 - 37 (U/L)   ALT 15  0 - 35 (U/L)   Alkaline Phosphatase 120 (*) 39 - 117 (U/L)   Total Bilirubin 0.3  0.3 - 1.2 (mg/dL)   GFR calc non Af Amer 40 (*) >90 (mL/min)   GFR calc Af Amer 47 (*) >90 (mL/min)  URINALYSIS, ROUTINE W REFLEX MICROSCOPIC      Component Value Range   Color, Urine YELLOW  YELLOW    Appearance CLEAR  CLEAR    Specific Gravity, Urine 1.019  1.005 - 1.030    pH 6.5  5.0 - 8.0    Glucose, UA NEGATIVE  NEGATIVE (mg/dL)   Hgb urine dipstick NEGATIVE  NEGATIVE    Bilirubin Urine NEGATIVE  NEGATIVE    Ketones, ur 15 (*) NEGATIVE (mg/dL)   Protein, ur NEGATIVE  NEGATIVE (mg/dL)   Urobilinogen, UA 0.2  0.0 - 1.0 (mg/dL)   Nitrite NEGATIVE  NEGATIVE    Leukocytes, UA NEGATIVE  NEGATIVE   APTT      Component Value Range   aPTT 28  24 - 37 (seconds)  PROTIME-INR      Component Value Range   Prothrombin Time 13.3  11.6 - 15.2 (seconds)   INR 0.99  0.00 - 1.49   SURGICAL PCR SCREEN      Component Value Range   MRSA, PCR NEGATIVE  NEGATIVE    Staphylococcus aureus NEGATIVE  NEGATIVE   VITAMIN B12      Component Value  Range   Vitamin B-12 1892 (*) 211 - 911 (pg/mL)  FOLATE      Component Value Range   Folate 16.5    IRON AND TIBC      Component Value Range   Iron 30 (*) 42 - 135 (ug/dL)   TIBC 846 (*) 962 - 952 (ug/dL)   Saturation Ratios 13 (*) 20 - 55 (%)   UIBC 198  125 - 400 (ug/dL)  FERRITIN      Component Value Range   Ferritin 89  10 - 291 (ng/mL)  RETICULOCYTES      Component Value Range   Retic Ct Pct 1.1  0.4 - 3.1 (%)   RBC. 3.95  3.87 - 5.11 (MIL/uL)   Retic Count, Manual 43.5  19.0 - 186.0 (K/uL)  SODIUM, URINE, RANDOM      Component Value Range   Sodium, Ur 55    CREATININE, URINE, RANDOM      Component Value Range   Creatinine, Urine 178.98    TSH      Component Value Range   TSH 0.712  0.350 - 4.500 (uIU/mL)  CBC      Component Value Range   WBC 9.3  4.0 - 10.5 (K/uL)   RBC 2.94 (*) 3.87 - 5.11 (MIL/uL)   Hemoglobin 8.5 (*) 12.0 - 15.0 (g/dL)   HCT 84.1 (*) 32.4 - 46.0 (%)   MCV 89.8  78.0 - 100.0 (fL)   MCH 28.9  26.0 - 34.0 (pg)   MCHC 32.2  30.0 - 36.0 (g/dL)   RDW 40.1  02.7 - 25.3 (%)   Platelets 177  150 - 400 (K/uL)  BASIC METABOLIC PANEL      Component Value Range   Sodium 138  135 - 145 (mEq/L)   Potassium 4.5  3.5 - 5.1 (mEq/L)   Chloride 102  96 - 112 (mEq/L)   CO2 29  19 - 32 (mEq/L)   Glucose, Bld 118 (*) 70 - 99 (  mg/dL)   BUN 25 (*) 6 - 23 (mg/dL)   Creatinine, Ser 7.82 (*) 0.50 - 1.10 (mg/dL)   Calcium 8.8  8.4 - 95.6 (mg/dL)   GFR calc non Af Amer 39 (*) >90 (mL/min)   GFR calc Af Amer 45 (*) >90 (mL/min)  PROTIME-INR      Component Value Range   Prothrombin Time 14.9  11.6 - 15.2 (seconds)   INR 1.15  0.00 - 1.49   PROTIME-INR      Component Value Range   Prothrombin Time 16.1 (*) 11.6 - 15.2 (seconds)   INR 1.26  0.00 - 1.49   CBC      Component Value Range   WBC 9.7  4.0 - 10.5 (K/uL)   RBC 2.65 (*) 3.87 - 5.11 (MIL/uL)   Hemoglobin 7.6 (*) 12.0 - 15.0 (g/dL)   HCT 21.3 (*) 08.6 - 46.0 (%)   MCV 89.4  78.0 - 100.0 (fL)   MCH 28.7   26.0 - 34.0 (pg)   MCHC 32.1  30.0 - 36.0 (g/dL)   RDW 57.8  46.9 - 62.9 (%)   Platelets 150  150 - 400 (K/uL)  BASIC METABOLIC PANEL      Component Value Range   Sodium 139  135 - 145 (mEq/L)   Potassium 4.0  3.5 - 5.1 (mEq/L)   Chloride 104  96 - 112 (mEq/L)   CO2 28  19 - 32 (mEq/L)   Glucose, Bld 124 (*) 70 - 99 (mg/dL)   BUN 29 (*) 6 - 23 (mg/dL)   Creatinine, Ser 5.28 (*) 0.50 - 1.10 (mg/dL)   Calcium 8.6  8.4 - 41.3 (mg/dL)   GFR calc non Af Amer 42 (*) >90 (mL/min)   GFR calc Af Amer 48 (*) >90 (mL/min)  PREPARE RBC (CROSSMATCH)      Component Value Range   Order Confirmation ORDER PROCESSED BY BLOOD BANK    TYPE AND SCREEN      Component Value Range   ABO/RH(D) O POS     Antibody Screen NEG     Sample Expiration 10/08/2011     Unit Number [id number]     Blood Component Type RED CELLS,LR     Unit division 00     Status of Unit ISSUED,FINAL     Transfusion Status OK TO TRANSFUSE     Crossmatch Result Compatible    PROTIME-INR      Component Value Range   Prothrombin Time 17.8 (*) 11.6 - 15.2 (seconds)   INR 1.44  0.00 - 1.49   BASIC METABOLIC PANEL      Component Value Range   Sodium 139  135 - 145 (mEq/L)   Potassium 4.0  3.5 - 5.1 (mEq/L)   Chloride 105  96 - 112 (mEq/L)   CO2 29  19 - 32 (mEq/L)   Glucose, Bld 119 (*) 70 - 99 (mg/dL)   BUN 23  6 - 23 (mg/dL)   Creatinine, Ser 5.36  0.50 - 1.10 (mg/dL)   Calcium 8.6  8.4 - 64.4 (mg/dL)   GFR calc non Af Amer 54 (*) >90 (mL/min)   GFR calc Af Amer 63 (*) >90 (mL/min)  ABO/RH      Component Value Range   ABO/RH(D) O POS    CBC      Component Value Range   WBC 10.9 (*) 4.0 - 10.5 (K/uL)   RBC 3.04 (*) 3.87 - 5.11 (MIL/uL)   Hemoglobin 8.9 (*) 12.0 - 15.0 (g/dL)  HCT 27.2 (*) 36.0 - 46.0 (%)   MCV 89.5  78.0 - 100.0 (fL)   MCH 29.3  26.0 - 34.0 (pg)   MCHC 32.7  30.0 - 36.0 (g/dL)   RDW 40.9  81.1 - 91.4 (%)   Platelets 163  150 - 400 (K/uL)    Discharge Medications:  norco 5/325 1 po q4 hours prn  pain Current Discharge Medication List    CONTINUE these medications which have NOT CHANGED   Details  aspirin 81 MG chewable tablet Chew 81 mg by mouth daily.      buPROPion (WELLBUTRIN SR) 200 MG 12 hr tablet Take 200 mg by mouth 2 (two) times daily.      donepezil (ARICEPT) 10 MG tablet Take 10 mg by mouth at bedtime.      LORazepam (ATIVAN) 0.5 MG tablet Take 1 mg by mouth at bedtime.      multivitamin (THERAGRAN) per tablet Take 1 tablet by mouth daily.      oxybutynin (DITROPAN) 5 MG tablet Take 5 mg by mouth 2 (two) times daily.      venlafaxine (EFFEXOR-XR) 75 MG 24 hr capsule Take 75 mg by mouth daily.          Diagnostic Studies: Dg Lumbar Spine Complete  10/03/2011  *RADIOLOGY REPORT*  Clinical Data: Fall  LUMBAR SPINE - COMPLETE 4+ VIEW  Comparison: None.  Findings: Anatomic alignment on the lateral view.  Mild levoscoliosis with the apex at L3-4.  No vertebral body height loss.  Severe multilevel this space narrowing and osteophyte formation.  Osteopenia.  IMPRESSION: No acute bony pathology.  Chronic change.  Original Report Authenticated By: Donavan Burnet, M.D.   Dg Hip Complete Right Done In Or 15 By Rmm  10/03/2011  *RADIOLOGY REPORT*  Clinical Data: Intertrochanteric fracture of the right hip.  RIGHT HIP - COMPLETE 2+ VIEW  Comparison: 10/02/2011.  Findings: Multiple fluoroscopic spot images demonstrate placement of an intermedullary rod and dynamic hip screw fixating the hip fracture with anatomic alignment.  IMPRESSION: Internal fixation with anatomic alignment.  Original Report Authenticated By: P. Loralie Champagne, M.D.   Dg Hip Complete Right  10/03/2011  *RADIOLOGY REPORT*  Clinical Data: Fall  RIGHT HIP - COMPLETE 2+ VIEW  Comparison: None.  Findings: Intertrochanteric right femur fracture is present with varus deformity.  Osteopenia.  Degenerative changes in the spine.  IMPRESSION: Intertrochanteric right femur fracture. Per CMS PQRS reporting requirements (PQRS  Measure 24): Given the patient's age of greater than 50 and the fracture site (hip, distal radius, or spine), the patient should be tested for osteoporosis using DXA, and the appropriate treatment considered based on the DXA results.  Original Report Authenticated By: Donavan Burnet, M.D.   Dg Chest Portable 1 View  10/03/2011  *RADIOLOGY REPORT*  Clinical Data: Preoperative radiograph, hip fracture.  PORTABLE CHEST - 1 VIEW  Comparison: 10/29/2005 abdominal CT.  Findings: Prominent mediastinal contours may reflect aortic tortuosity however thoracic aortic aneurysm not excluded.  Heart size mildly enlarged.  Interstitial prominence.  Linear left lung base opacity likely reflects scarring or atelectasis.  Osteopenia. No definite acute osseous abnormality.  Small hiatal hernia.  IMPRESSION: Cardiomegaly and prominent mediastinal contours.  May reflect aortic tortuosity however aneurysmal dilatation not excluded.  Interstitial prominence.  Linear left lung base opacity likely reflects scarring or atelectasis.  Original Report Authenticated By: Waneta Martins, M.D.   Dg C-arm 1-60 Min  10/03/2011  CLINICAL DATA: orif right hip fx  C-ARM 1-60 MINUTES  Fluoroscopy was utilized by the requesting physician.  No radiographic  interpretation.      Disposition:  D/C to SNF  1) WBAT Right LE 2) dry dressing daily to right hip incisions 3) f/u 2 weeks      Signed: Kathryne Hitch 10/07/2011, 7:09 AM

## 2011-10-07 NOTE — Progress Notes (Signed)
Physical Therapy Treatment Patient Details Name: Vickie Hoffman MRN: [id number] DOB: 1920/05/12 Today's Date: 10/07/2011  PT Assessment/Plan  PT - Assessment/Plan Comments on Treatment Session: Pt. not progressing today due to increase lethargy. RN stated ok to try to work with patient and get to recliner. Pt not as alert as yesterday but was able to open eyes while EOB and was getting agitated when attempting to transfer PT Plan: Discharge plan remains appropriate PT Frequency: Min 5X/week Follow Up Recommendations: Skilled nursing facility Equipment Recommended: Defer to next venue PT Goals  Acute Rehab PT Goals PT Goal: Supine/Side to Sit - Progress: Not met PT Transfer Goal: Sit to Stand/Stand to Sit - Progress: Not met PT Transfer Goal: Bed to Chair/Chair to Bed - Progress: Not met  PT Treatment Precautions/Restrictions  Precautions Precautions: Fall Required Braces or Orthoses: No Restrictions Weight Bearing Restrictions: Yes RLE Weight Bearing: Weight bearing as tolerated Mobility (including Balance) Bed Mobility Bed Mobility: Yes Supine to Sit: 1: +2 Total assist;Patient percentage (comment) (10%) Supine to Sit Details (indicate cue type and reason): A required for all aspects. Pt with heavy posterior lean.  Sitting - Scoot to Edge of Bed: 1: +1 Total assist Sitting - Scoot to Edge of Bed Details (indicate cue type and reason): required use of chuck pad Transfers Transfers: Yes Squat Pivot Transfers: 1: +2 Total assist;Patient percentage (comment) (pt=0%) Squat Pivot Transfer Details (indicate cue type and reason): A required for all aspects. Pt slightly positioned her feet but unable to continue. A to position hips towards recliner. Ambulation/Gait Ambulation/Gait: No Stairs: No Wheelchair Mobility Wheelchair Mobility: No  Static Sitting Balance Static Sitting - Balance Support: Bilateral upper extremity supported Static Sitting - Level of Assistance: 3: Mod  assist Static Sitting - Comment/# of Minutes: pt with heavy left lateral lean today in sitting. Able to have patient open eyes and speak for breif periods of time.  Exercise    End of Session PT - End of Session Equipment Utilized During Treatment: Gait belt Activity Tolerance: Other (comment);Treatment limited secondary to medical complications (Comment) (pt lethargic. RN stated pt had pain meds earlier) Patient left: in chair;with call bell in reach Nurse Communication: Mobility status for transfers General Behavior During Session: Lethargic Cognition: Impaired, at baseline  Raiyah Speakman, Adline Potter 10/07/2011, 1:24 PM 10/07/2011 Fredrich Birks PTA [phone] pager [phone] office

## 2011-10-07 NOTE — Progress Notes (Addendum)
Pt upon assessment noted to be lethargic but arousable. Pt, per rept, received oxycodone 10 mg at 0600 AM. Pt, per NT rept, this AM ate all of her breakfast with her assistance. Pt swats at this nurse and says, "leave me alone" while pt being assessed. Pt noted to have fine rales throughout lung lobes that clears with cough. Pt has a nonprod cough. Pt noted to have diminised lung sounds in the bases. Pt sats 94% RA. Pt heart rate noted to be irreg irreg this AM with apical pulse in the low 100's. Pt has 2 stained mepilex dressings to R hip. Pt, per rept, has a history of incontinence and dementia. Pt, due to lethargy, doesn't follow commands but can state who she is this AM. Pt, per rept, is a total care and tends to chew pills so pills being administered in puree. Pt being assisted with meals. Pt speech is not clear due to lethargy and dry mouth from snoring. Facial symmetry is WNL and pt can MAEx 4 at command.

## 2011-10-07 NOTE — Progress Notes (Addendum)
ANTICOAGULATION CONSULT NOTE - Follow Up Consult  Pharmacy Consult for Coumadin Indication: VTE prophylaxis  Allergies  Allergen Reactions  . Statins   . Sulfa Antibiotics     Patient Measurements: Height: 5\' 8"  (172.7 cm) Weight: 137 lb 11.2 oz (62.46 kg) IBW/kg (Calculated) : 63.9    Vital Signs: Temp: 99.1 F (37.3 C) (11/13 0515) BP: 155/75 mmHg (11/13 1230) Pulse Rate: 89  (11/13 1230)  Labs:  Basename 10/07/11 1125 10/07/11 0625 10/06/11 0917 10/06/11 0916 10/05/11 0510  HGB 9.6* -- 8.9* -- --  HCT 29.1* -- 27.2* -- 23.7*  PLT 230 -- 163 -- 150  APTT -- -- -- -- --  LABPROT -- 20.4* -- 17.8* 16.1*  INR -- 1.71* -- 1.44 1.26  HEPARINUNFRC -- -- -- -- --  CREATININE 0.79 -- -- 0.91 1.13*  CKTOTAL 63 -- -- -- --  CKMB 2.4 -- -- -- --  TROPONINI <0.30 -- -- -- --   Estimated Creatinine Clearance: 47 ml/min (by C-G formula based on Cr of 0.79).   Medications:  Assessment: POD# 4 ORIF right femur/hip fx. INR 1.71 up from 1.44. HGB 9.6. Decreased LOC and decreased resps. Received 10mg  oxycodone this AM around 0630. HR 103, BP 160/80.  Initial plan to dc to SNF today but DC canceled and pt is to be transferred to tele floor.    Goal of Therapy:  INR 2-3   Plan: Coumadin 2.5mg  po today x 1  INR qAM.   Arman Filter 10/07/2011,1:36 PM

## 2011-10-08 ENCOUNTER — Encounter (HOSPITAL_COMMUNITY): Payer: Self-pay | Admitting: Orthopaedic Surgery

## 2011-10-08 ENCOUNTER — Other Ambulatory Visit: Payer: Self-pay

## 2011-10-08 ENCOUNTER — Inpatient Hospital Stay (HOSPITAL_COMMUNITY): Payer: Medicare Other

## 2011-10-08 DIAGNOSIS — R000 Tachycardia, unspecified: Secondary | ICD-10-CM | POA: Diagnosis not present

## 2011-10-08 LAB — PROTIME-INR
INR: 1.71 — ABNORMAL HIGH (ref 0.00–1.49)
Prothrombin Time: 20.4 seconds — ABNORMAL HIGH (ref 11.6–15.2)

## 2011-10-08 MED ORDER — ALPRAZOLAM 0.25 MG PO TABS: 0.2500 mg | ORAL_TABLET | Freq: Three times a day (TID) | ORAL | Status: DC | PRN

## 2011-10-08 MED ORDER — METOPROLOL TARTRATE 12.5 MG HALF TABLET
12.5000 mg | ORAL_TABLET | Freq: Two times a day (BID) | ORAL | Status: DC
  Administered 2011-10-08 – 2011-10-09 (×3): 12.5 mg via ORAL
  Filled 2011-10-08 (×4): qty 1

## 2011-10-08 MED ORDER — WARFARIN SODIUM 4 MG PO TABS
4.0000 mg | ORAL_TABLET | Freq: Once | ORAL | Status: AC
  Administered 2011-10-08: 4 mg via ORAL
  Filled 2011-10-08: qty 1

## 2011-10-08 MED ORDER — ACETAMINOPHEN 325 MG PO TABS: 650.0000 mg | ORAL_TABLET | ORAL | Status: DC | PRN

## 2011-10-08 NOTE — Progress Notes (Signed)
ANTICOAGULATION CONSULT NOTE - Follow Up Consult  Pharmacy Consult for Coumadin Indication: VTE px s/p hip compression  Allergies  Allergen Reactions  . Statins   . Sulfa Antibiotics     Patient Measurements: Height: 5\' 8"  (172.7 cm) Weight: 137 lb 11.2 oz (62.46 kg) IBW/kg (Calculated) : 63.9    Vital Signs: BP: 176/60 mmHg (11/14 0917)  Labs:  Basename 10/08/11 0650 10/07/11 1125 10/07/11 0625 10/06/11 0917 10/06/11 0916  HGB -- 9.6* -- 8.9* --  HCT -- 29.1* -- 27.2* --  PLT -- 230 -- 163 --  APTT -- -- -- -- --  LABPROT 20.4* -- 20.4* -- 17.8*  INR 1.71* -- 1.71* -- 1.44  HEPARINUNFRC -- -- -- -- --  CREATININE -- 0.79 -- -- 0.91  CKTOTAL -- 63 -- -- --  CKMB -- 2.4 -- -- --  TROPONINI -- <0.30 -- -- --   Estimated Creatinine Clearance: 47 ml/min (by C-G formula based on Cr of 0.79).   Medications:  Scheduled:    . amLODipine  5 mg Oral Daily  . aspirin  81 mg Oral Daily  . buPROPion  200 mg Oral BID  . donepezil  10 mg Oral QHS  . ferrous sulfate  325 mg Oral BID WC  . metoprolol tartrate  12.5 mg Oral BID  . multivitamins ther. w/minerals  1 tablet Oral Daily  . naloxone      . oxybutynin  5 mg Oral BID  . venlafaxine  75 mg Oral Daily  . warfarin  2.5 mg Oral ONCE-1800  . warfarin   Does not apply Once   Goal of Therapy:  INR 2-3   Assessment & Plan:  77yo female on Coumadin for VTE px.  INR stable at 1.71 this AM, following good jump yesterday.  No problems noted.  1.  Coumadin 4mg  2.  F/U AM  Santana Edell P 10/08/2011,2:17 PM

## 2011-10-08 NOTE — Progress Notes (Signed)
Pt with orders for CT angio to r/o PE. Will hold till results. Ivonne Andrew, PT, DPT [phone]

## 2011-10-08 NOTE — Progress Notes (Signed)
Subjective: Patient is fully awake and alert this morning. She states that she has some mild back pain. She otherwise has no other complaint. The patient does have dementia but she is able to verbalize her concerns. I spoke with the patient's daughter-in-law Aariana Shankland and updated her in the patient's condition from the perspective of internal medicine. I've also spoken with Dr. Magnus Ivan and discuss the patient's condition with him.  Interval history: The patient has had heart rates have been in the 120s to 150s. The rhythm has been sinus tachycardia. A 12-lead EKG was done this morning has been reviewed by me which showed sinus tachycardia with known old left bundle branch block. Objective: Filed Vitals:   10/07/11 1400 10/07/11 1612 10/07/11 1735 10/07/11 2316  BP: 148/78 178/85 180/60 176/78  Pulse: 90 105 109 108  Temp: 98.6 F (37 C) 97.7 F (36.5 C)  97.1 F (36.2 C)  TempSrc: Oral   Oral  Resp: 20 16  18   Height:      Weight:      SpO2: 98% 97%  90%   Weight change:   Intake/Output Summary (Last 24 hours) at 10/08/11 0851 Last data filed at 10/08/11 0600  Gross per 24 hour  Intake    450 ml  Output      0 ml  Net    450 ml    General: Alert, awake, oriented to person, in no acute distress.  HEENT: [place]/AT PEERL, EOMI Neck: Trachea midline,  no masses, no thyromegal,y no JVD, no carotid bruit OROPHARYNX:  Moist, No exudate/ erythema/lesions.  Heart: Regular rate and rhythm, without murmurs, rubs, gallops, PMI non-displaced, no heaves or thrills on palpation.  Lungs: Clear to auscultation, no wheezing or rhonchi noted. No increased vocal fremitus resonant to percussion  Abdomen: Soft, nontender, nondistended, positive bowel sounds, no masses no hepatosplenomegaly noted..  Neuro: No focal neurological deficits noted cranial nerves II through XII grossly intact. DTRs 2+ bilaterally upper and lower extremities. Strength 5 out of 5 in bilateral upper and lower  extremities. Musculoskeletal: No warm swelling or erythema around joints, no spinal tenderness noted. Psychiatric: Patient alert and oriented x3, good insight and cognition, good recent to remote recall. Lymph node survey: No cervical axillary or inguinal lymphadenopathy noted.     Lab Results:  [facility] 10/07/11 1125 10/06/11 0916  NA 139 139  K 3.7 4.0  CL 104 105  CO2 30 29  GLUCOSE 146* 119*  BUN 18 23  CREATININE 0.79 0.91  CALCIUM 9.0 8.6  MG -- --  PHOS -- --     Basename 10/07/11 1125 10/06/11 0917  WBC 11.2* 10.9*  NEUTROABS -- --  HGB 9.6* 8.9*  HCT 29.1* 27.2*  MCV 90.1 89.5  PLT 230 163    Basename 10/07/11 1125  CKTOTAL 63  CKMB 2.4  CKMBINDEX --  TROPONINI <0.30   No results found for this basename: POCBNP:3 in the last 72 hours No results found for this basename: DDIMER:2 in the last 72 hours No results found for this basename: HGBA1C:2 in the last 72 hours No results found for this basename: CHOL:2,HDL:2,LDLCALC:2,TRIG:2,CHOLHDL:2,LDLDIRECT:2 in the last 72 hours No results found for this basename: TSH,T4TOTAL,FREET3,T3FREE,THYROIDAB in the last 72 hours No results found for this basename: VITAMINB12:2,FOLATE:2,FERRITIN:2,TIBC:2,IRON:2,RETICCTPCT:2 in the last 72 hours  Micro Results: Recent Results (from the past 240 hour(s))  SURGICAL PCR SCREEN     Status: Normal   Collection Time   10/03/11  3:01 AM  Component Value Range Status Comment   MRSA, PCR NEGATIVE  NEGATIVE  Final    Staphylococcus aureus NEGATIVE  NEGATIVE  Final     Studies/Results: Dg Lumbar Spine Complete  10/03/2011  *RADIOLOGY REPORT*  Clinical Data: Fall  LUMBAR SPINE - COMPLETE 4+ VIEW  Comparison: None.  Findings: Anatomic alignment on the lateral view.  Mild levoscoliosis with the apex at L3-4.  No vertebral body height loss.  Severe multilevel this space narrowing and osteophyte formation.  Osteopenia.  IMPRESSION: No acute bony pathology.  Chronic change.  Original  Report Authenticated By: Donavan Burnet, M.D.   Dg Hip Complete Right Done In Or 15 By Rmm  10/03/2011  *RADIOLOGY REPORT*  Clinical Data: Intertrochanteric fracture of the right hip.  RIGHT HIP - COMPLETE 2+ VIEW  Comparison: 10/02/2011.  Findings: Multiple fluoroscopic spot images demonstrate placement of an intermedullary rod and dynamic hip screw fixating the hip fracture with anatomic alignment.  IMPRESSION: Internal fixation with anatomic alignment.  Original Report Authenticated By: P. Loralie Champagne, M.D.   Dg Hip Complete Right  10/03/2011  *RADIOLOGY REPORT*  Clinical Data: Fall  RIGHT HIP - COMPLETE 2+ VIEW  Comparison: None.  Findings: Intertrochanteric right femur fracture is present with varus deformity.  Osteopenia.  Degenerative changes in the spine.  IMPRESSION: Intertrochanteric right femur fracture. Per CMS PQRS reporting requirements (PQRS Measure 24): Given the patient's age of greater than 50 and the fracture site (hip, distal radius, or spine), the patient should be tested for osteoporosis using DXA, and the appropriate treatment considered based on the DXA results.  Original Report Authenticated By: Donavan Burnet, M.D.   Dg Chest Portable 1 View  10/03/2011  *RADIOLOGY REPORT*  Clinical Data: Preoperative radiograph, hip fracture.  PORTABLE CHEST - 1 VIEW  Comparison: 10/29/2005 abdominal CT.  Findings: Prominent mediastinal contours may reflect aortic tortuosity however thoracic aortic aneurysm not excluded.  Heart size mildly enlarged.  Interstitial prominence.  Linear left lung base opacity likely reflects scarring or atelectasis.  Osteopenia. No definite acute osseous abnormality.  Small hiatal hernia.  IMPRESSION: Cardiomegaly and prominent mediastinal contours.  May reflect aortic tortuosity however aneurysmal dilatation not excluded.  Interstitial prominence.  Linear left lung base opacity likely reflects scarring or atelectasis.  Original Report Authenticated By: Waneta Martins, M.D.   Dg C-arm 1-60 Min  10/03/2011  CLINICAL DATA: orif right hip fx   C-ARM 1-60 MINUTES  Fluoroscopy was utilized by the requesting physician.  No radiographic  interpretation.      Medications: I have reviewed the patient's current medications. Scheduled Meds:   . amLODipine  5 mg Oral Daily  . aspirin  81 mg Oral Daily  . buPROPion  200 mg Oral BID  . donepezil  10 mg Oral QHS  . ferrous sulfate  325 mg Oral BID WC  . metoprolol tartrate  12.5 mg Oral BID  . multivitamins ther. w/minerals  1 tablet Oral Daily  . naloxone      . oxybutynin  5 mg Oral BID  . venlafaxine  75 mg Oral Daily  . warfarin  2.5 mg Oral ONCE-1800  . warfarin   Does not apply Once   Continuous Infusions:   . sodium chloride    . sodium chloride Stopped (10/08/11 0400)   PRN Meds:.acetaminophen, ALPRAZolam, diphenhydrAMINE, HYDROcodone-acetaminophen, metoCLOPramide (REGLAN) injection, metoCLOPramide, morphine, ondansetron (ZOFRAN) IV, ondansetron, senna-docusate, DISCONTD: oxyCODONE Assessment/Plan: Patient Active Hospital Problem List: Femur fracture, right (10/03/2011)   Assessment: Patient stable  from perspective of orthopedics. Will defer to Dr. Rayburn Ma for any further treatments.   Dementia (10/03/2011)   Assessment: The family has verbalized the worsening of the patient's dementia since she has been in this setting. I've explained to them to change of venue will likely lead to a worsening of her symptoms which will likely lead loss 1 she's in her previously familiar setting or when she becomes familiar with this setting that she's been placed in.    HTN (hypertension) (10/03/2011)   Assessment: The patient does not have a known history of hypertension however has been hypertensive in the last 3 days. I started the patient on Norvasc. In light of her sinus tachycardia, also plan a low-dose beta blocker and observe the patient. I'm starting the patient on Lopressor 12.5 mg by mouth twice a  day in addition to the Norvasc.   ARF (acute renal failure) (10/03/2011)   Assessment: Renal function as of yesterday and has been within normal limits with a creatinine of 0.79 and a BUN of 18.   Sinus tachycardia: The patient has had sinus tachycardia since yesterday. She's been observed on telemetry this appears to be a new problem for her. It is unclear as to whether or not there may be underlying cause for her. The patient was started on Lopressor 12.5 mg by mouth twice a day. She will also be investigated to make sure that she does not have a pulmonary embolus which would be a likely cause in the setting of a postsurgical state.   Anemia (10/03/2011)   Assessment: Hemoglobin stable will monitor.     LOS: 6 days

## 2011-10-08 NOTE — Progress Notes (Signed)
Pt's discharge has been delayed. CSW informed facility of potential discharge on 10/09/11. CSW will continue to follow to facilitate pt's discharge to [facility].  Dede Query, MSW, Theresia Majors [phone]

## 2011-10-08 NOTE — Progress Notes (Signed)
reviewed

## 2011-10-08 NOTE — Progress Notes (Signed)
Subjective: On telemetry due to questionable afib.  Doing well now with no further interventions needed. No acute changes overnight.  OK for D/C to SNF today.  Objective: Vital signs in last 24 hours: Temp:  [97.1 F (36.2 C)-98.6 F (37 C)] 97.1 F (36.2 C) (11/13 2316) Pulse Rate:  [89-125] 108  (11/13 2316) Resp:  [12-20] 18  (11/13 2316) BP: (148-180)/(60-85) 176/78 mmHg (11/13 2316) SpO2:  [90 %-100 %] 90 % (11/13 2316) FiO2 (%):  [100 %] 100 % (11/13 1130)  Intake/Output from previous day: 11/13 0701 - 11/14 0700 In: 450 [I.V.:450] Out: -  Intake/Output this shift:     Basename 10/07/11 1125 10/06/11 0917  HGB 9.6* 8.9*    Basename 10/07/11 1125 10/06/11 0917  WBC 11.2* 10.9*  RBC 3.23* 3.04*  HCT 29.1* 27.2*  PLT 230 163    Basename 10/07/11 1125 10/06/11 0916  NA 139 139  K 3.7 4.0  CL 104 105  CO2 30 29  BUN 18 23  CREATININE 0.79 0.91  GLUCOSE 146* 119*  CALCIUM 9.0 8.6    Basename 10/08/11 0650 10/07/11 0625  LABPT -- --  INR 1.71* 1.71*    Neurovascular intact Intact pulses distally Incision: scant drainage  Assessment/Plan: Can d/c to snf today.   Selby Slovacek Y 10/08/2011, 7:45 AM

## 2011-10-08 NOTE — Progress Notes (Signed)
Pts HR noted to be in the 120s-130s, EKG showed ST with PVCs, BP 176/60; Dr.Matthews paged and made aware, will continue to monitor

## 2011-10-09 MED ORDER — WARFARIN SODIUM 2.5 MG PO TABS
2.5000 mg | ORAL_TABLET | Freq: Once | ORAL | Status: DC
  Filled 2011-10-09: qty 1

## 2011-10-09 MED ORDER — METOPROLOL TARTRATE 12.5 MG HALF TABLET: 12.5000 mg | ORAL_TABLET | Freq: Two times a day (BID) | ORAL | Status: DC

## 2011-10-09 MED ORDER — ACETAMINOPHEN 325 MG PO TABS: 650.0000 mg | ORAL_TABLET | Freq: Four times a day (QID) | ORAL | Status: AC | PRN

## 2011-10-09 NOTE — Progress Notes (Signed)
  Doing much better this am.  Not tachy.  CT scan with no PE.  Rate better contolled on meds. No acute changes. Physical exam unchanged.  PLAN: 1) discharge today to SNF.

## 2011-10-09 NOTE — Progress Notes (Signed)
Physical Therapy Treatment Patient Details Name: Vickie Hoffman MRN: [id number] DOB: 11/09/1920 Today's Date: 10/09/2011  PT Assessment/Plan  PT - Assessment/Plan Comments on Treatment Session: Pt much better than last PT visit; much more alert and participatory; able stand for a longer period of time with less assist; still doesn't like to put weight through RLE PT Plan: Discharge plan remains appropriate PT Goals  Acute Rehab PT Goals PT Goal Formulation: Patient unable to participate in goal setting PT Goal: Supine/Side to Sit - Progress: Progressing toward goal PT Goal: Sit to Supine/Side - Progress: Progressing toward goal PT Transfer Goal: Sit to Stand/Stand to Sit - Progress: Progressing toward goal PT Transfer Goal: Bed to Chair/Chair to Bed - Progress: Progressing toward goal PT Goal: Ambulate - Progress: Progressing toward goal  PT Treatment Precautions/Restrictions  Precautions Precautions: Fall Required Braces or Orthoses: No Restrictions Weight Bearing Restrictions: No RLE Weight Bearing: Weight bearing as tolerated Other Position/Activity Restrictions: right hip WBAT Mobility (including Balance) Bed Mobility Bed Mobility: Yes Supine to Sit: 1: +2 Total assist Supine to Sit Details (indicate cue type and reason): +2totalpt20% with assistance for sequencing/facilitation to bring self into sitting. Pt with difficulty following verbal commands Sitting - Scoot to Edge of Bed: 2: Max assist Transfers Sit to Stand: 1: +2 Total assist Sit to Stand Details (indicate cue type and reason): +2totalpt50%; pt with better ability to extend to upright standing. put RW in front of pt and she was able to stand for 1-2 minutes while therapy technician assited pt totalA with pericare as pt soaked with urine and baby powder Stand to Sit: 2: Max assist Stand to Sit Details: maxA to facilitate safe movement, slowly lower pt to chair Ambulation/Gait Ambulation/Gait:  Yes Ambulation/Gait Assistance: 2: Max assist Ambulation/Gait Assistance Details (indicate cue type and reason): Pt took 3 steps forward with RW; difficulty with foot placement especially RLE pt adducting and ambulating close to left side of RW with poor ability to stay upright needing max cueing for foot placement; pt too fatigued and needing to sit; recliner brought behind her Ambulation Distance (Feet):  (3 steps) Assistive device: Rolling walker Gait Pattern: Trunk flexed;Lateral trunk lean to right;Decreased stride length;Decreased hip/knee flexion - right;Decreased hip/knee flexion - left;Decreased stance time - right  Static Sitting Balance Static Sitting - Balance Support: Bilateral upper extremity supported Static Sitting - Level of Assistance: 4: Min assist Static Sitting - Comment/# of Minutes: pt needing cueing to put feet flat and lean forward; tends toward posterior lean Exercise  Total Joint Exercises Ankle Circles/Pumps: AROM;Both;5 reps;Supine Quad Sets: AROM;Right;5 reps;Supine Heel Slides: AAROM;Right;5 reps;Supine Straight Leg Raises: AAROM;Right;5 reps;Supine (limited by cognition) End of Session PT - End of Session Equipment Utilized During Treatment: Gait belt Activity Tolerance: Patient limited by fatigue Patient left: in chair;with call bell in reach (with chair alarm) Nurse Communication: Mobility status for transfers General Behavior During Session: [facility] for tasks performed Cognition: Impaired, at baseline Cognitive Impairment: dementia  Vickie Hoffman 10/09/2011, 11:59 AM

## 2011-10-09 NOTE — Discharge Summary (Signed)
  Addendum to previous discharge summary.  Vickie Hoffman was kept another day due to tachycardia.  Her heart rate improved on metoprolol. A CT scan was negative for PE.  PLAN:  Discharge patient today to SNF.  See previous dictated discharge summaries on EPIC.

## 2011-10-09 NOTE — Progress Notes (Signed)
Pt is ready for discharge today to [facility]. Pt's discharge summary has been faxed to facility. Pt and family are aware of discharge plan and agreeable to discharge plan. [facility] will provide transportation. CSW signing off, as no further social work needs identified.   Dede Query, MSW, Theresia Majors [phone]

## 2011-10-09 NOTE — Progress Notes (Signed)
ANTICOAGULATION CONSULT NOTE - Follow Up Consult  Pharmacy Consult for Coumadin Indication: VTE prophylaxis  Allergies  Allergen Reactions  . Statins   . Sulfa Antibiotics     Patient Measurements: Height: 5\' 8"  (172.7 cm) Weight: 137 lb 11.2 oz (62.46 kg) IBW/kg (Calculated) : 63.9   Vital Signs: Temp: 98.6 F (37 C) (11/15 0617) Temp src: Oral (11/15 0617) BP: 152/72 mmHg (11/15 0617) Pulse Rate: 100  (11/15 0617)  Labs:  Basename 10/09/11 0640 10/08/11 0650 10/07/11 1125 10/07/11 0625  HGB -- -- 9.6* --  HCT -- -- 29.1* --  PLT -- -- 230 --  APTT -- -- -- --  LABPROT 23.3* 20.4* -- 20.4*  INR 2.03* 1.71* -- 1.71*  HEPARINUNFRC -- -- -- --  CREATININE -- -- 0.79 --  CKTOTAL -- -- 63 --  CKMB -- -- 2.4 --  TROPONINI -- -- <0.30 --   Estimated Creatinine Clearance: 47 ml/min (by C-G formula based on Cr of 0.79).   Medications:  Scheduled:    . amLODipine  5 mg Oral Daily  . aspirin  81 mg Oral Daily  . buPROPion  200 mg Oral BID  . donepezil  10 mg Oral QHS  . ferrous sulfate  325 mg Oral BID WC  . metoprolol tartrate  12.5 mg Oral BID  . multivitamins ther. w/minerals  1 tablet Oral Daily  . oxybutynin  5 mg Oral BID  . venlafaxine  75 mg Oral Daily  . warfarin  4 mg Oral ONCE-1800  . DISCONTD: warfarin   Does not apply Once   Goal of Therapy:  INR 2-3   Assessment & Plan:  77yo female on Coumadin for VTE px s/p hip compression.  INR 2.03 this AM.  No problems noted.  1.  Coumadin 2.5mg  2.  F/U AM  Vickie Hoffman P 10/09/2011,11:51 AM

## 2012-06-23 ENCOUNTER — Encounter (HOSPITAL_COMMUNITY): Payer: Self-pay | Admitting: Emergency Medicine

## 2012-06-23 ENCOUNTER — Inpatient Hospital Stay (HOSPITAL_COMMUNITY)
Admission: EM | Admit: 2012-06-23 | Discharge: 2012-06-26 | DRG: 536 | Disposition: A | Discharge status: Home / Self Care | Payer: Medicare Other | Attending: Internal Medicine | Admitting: Internal Medicine

## 2012-06-23 ENCOUNTER — Emergency Department (HOSPITAL_COMMUNITY): Payer: Medicare Other

## 2012-06-23 DIAGNOSIS — I454 Nonspecific intraventricular block: Secondary | ICD-10-CM | POA: Diagnosis present

## 2012-06-23 DIAGNOSIS — F32A Depression, unspecified: Secondary | ICD-10-CM | POA: Diagnosis present

## 2012-06-23 DIAGNOSIS — Z7982 Long term (current) use of aspirin: Secondary | ICD-10-CM

## 2012-06-23 DIAGNOSIS — N189 Chronic kidney disease, unspecified: Secondary | ICD-10-CM | POA: Diagnosis present

## 2012-06-23 DIAGNOSIS — F329 Major depressive disorder, single episode, unspecified: Secondary | ICD-10-CM | POA: Diagnosis present

## 2012-06-23 DIAGNOSIS — F3289 Other specified depressive episodes: Secondary | ICD-10-CM | POA: Diagnosis present

## 2012-06-23 DIAGNOSIS — I498 Other specified cardiac arrhythmias: Secondary | ICD-10-CM | POA: Diagnosis present

## 2012-06-23 DIAGNOSIS — Z79899 Other long term (current) drug therapy: Secondary | ICD-10-CM

## 2012-06-23 DIAGNOSIS — E860 Dehydration: Secondary | ICD-10-CM | POA: Diagnosis present

## 2012-06-23 DIAGNOSIS — S32519A Fracture of superior rim of unspecified pubis, initial encounter for closed fracture: Secondary | ICD-10-CM | POA: Diagnosis present

## 2012-06-23 DIAGNOSIS — D649 Anemia, unspecified: Secondary | ICD-10-CM | POA: Diagnosis present

## 2012-06-23 DIAGNOSIS — M810 Age-related osteoporosis without current pathological fracture: Secondary | ICD-10-CM | POA: Diagnosis present

## 2012-06-23 DIAGNOSIS — F411 Generalized anxiety disorder: Secondary | ICD-10-CM | POA: Diagnosis present

## 2012-06-23 DIAGNOSIS — F0390 Unspecified dementia without behavioral disturbance: Secondary | ICD-10-CM | POA: Diagnosis present

## 2012-06-23 DIAGNOSIS — K5900 Constipation, unspecified: Secondary | ICD-10-CM | POA: Diagnosis present

## 2012-06-23 DIAGNOSIS — Y92009 Unspecified place in unspecified non-institutional (private) residence as the place of occurrence of the external cause: Secondary | ICD-10-CM

## 2012-06-23 DIAGNOSIS — S32509A Unspecified fracture of unspecified pubis, initial encounter for closed fracture: Principal | ICD-10-CM | POA: Diagnosis present

## 2012-06-23 DIAGNOSIS — F0150 Vascular dementia without behavioral disturbance: Secondary | ICD-10-CM | POA: Diagnosis present

## 2012-06-23 DIAGNOSIS — S329XXA Fracture of unspecified parts of lumbosacral spine and pelvis, initial encounter for closed fracture: Secondary | ICD-10-CM

## 2012-06-23 DIAGNOSIS — I10 Essential (primary) hypertension: Secondary | ICD-10-CM | POA: Diagnosis present

## 2012-06-23 DIAGNOSIS — I129 Hypertensive chronic kidney disease with stage 1 through stage 4 chronic kidney disease, or unspecified chronic kidney disease: Secondary | ICD-10-CM | POA: Diagnosis present

## 2012-06-23 DIAGNOSIS — W050XXA Fall from non-moving wheelchair, initial encounter: Secondary | ICD-10-CM | POA: Diagnosis present

## 2012-06-23 DIAGNOSIS — F419 Anxiety disorder, unspecified: Secondary | ICD-10-CM | POA: Diagnosis present

## 2012-06-23 HISTORY — DX: Depression, unspecified: F32.A

## 2012-06-23 HISTORY — DX: Major depressive disorder, single episode, unspecified: F32.9

## 2012-06-23 HISTORY — DX: Anxiety disorder, unspecified: F41.9

## 2012-06-23 LAB — COMPREHENSIVE METABOLIC PANEL
ALT: 32 U/L (ref 0–35)
Albumin: 3.5 g/dL (ref 3.5–5.2)
Calcium: 9.6 mg/dL (ref 8.4–10.5)
GFR calc Af Amer: 59 mL/min — ABNORMAL LOW (ref >90)
Glucose, Bld: 119 mg/dL — ABNORMAL HIGH (ref 70–99)
Sodium: 138 mEq/L (ref 135–145)
Total Protein: 6.7 g/dL (ref 6.0–8.3)

## 2012-06-23 LAB — CARDIAC PANEL(CRET KIN+CKTOT+MB+TROPI)
CK, MB: 1.9 ng/mL (ref 0.3–4.0)
Relative Index: INVALID (ref 0.0–2.5)
Relative Index: INVALID (ref 0.0–2.5)
Total CK: 69 U/L (ref 7–177)

## 2012-06-23 LAB — CBC
HCT: 30.8 % — ABNORMAL LOW (ref 36.0–46.0)
HCT: 30.7 % — ABNORMAL LOW (ref 36.0–46.0)
Hemoglobin: 10.4 g/dL — ABNORMAL LOW (ref 12.0–15.0)
MCH: 29.9 pg (ref 26.0–34.0)
MCHC: 33.8 g/dL (ref 30.0–36.0)
MCHC: 33.6 g/dL (ref 30.0–36.0)
MCV: 87.5 fL (ref 78.0–100.0)
MCV: 89 fL (ref 78.0–100.0)
RDW: 14.6 % (ref 11.5–15.5)
WBC: 9.1 10*3/uL (ref 4.0–10.5)

## 2012-06-23 LAB — CBC WITH DIFFERENTIAL/PLATELET
Basophils Absolute: 0 10*3/uL (ref 0.0–0.1)
Basophils Relative: 0 % (ref 0–1)
Eosinophils Absolute: 0.1 10*3/uL (ref 0.0–0.7)
Lymphocytes Relative: 11 % — ABNORMAL LOW (ref 12–46)
MCH: 29.5 pg (ref 26.0–34.0)
MCHC: 33.3 g/dL (ref 30.0–36.0)
Monocytes Absolute: 0.9 10*3/uL (ref 0.1–1.0)
Neutrophils Relative %: 79 % — ABNORMAL HIGH (ref 43–77)
Platelets: ADEQUATE 10*3/uL (ref 150–400)
RDW: 14.5 % (ref 11.5–15.5)

## 2012-06-23 LAB — URINALYSIS, ROUTINE W REFLEX MICROSCOPIC
Hgb urine dipstick: NEGATIVE
Nitrite: NEGATIVE
Specific Gravity, Urine: 1.025 (ref 1.005–1.030)
Urobilinogen, UA: 0.2 mg/dL (ref 0.0–1.0)
pH: 7 (ref 5.0–8.0)

## 2012-06-23 LAB — CREATININE, SERUM: GFR calc Af Amer: 56 mL/min — ABNORMAL LOW (ref >90)

## 2012-06-23 MED ORDER — DONEPEZIL HCL 10 MG PO TABS
10.0000 mg | ORAL_TABLET | Freq: Every day | ORAL | Status: DC
  Administered 2012-06-23 – 2012-06-25 (×3): 10 mg via ORAL
  Filled 2012-06-23 (×5): qty 1

## 2012-06-23 MED ORDER — SODIUM CHLORIDE 0.9 % IV SOLN: INTRAVENOUS | Status: DC

## 2012-06-23 MED ORDER — ASPIRIN 81 MG PO CHEW
81.0000 mg | CHEWABLE_TABLET | Freq: Every day | ORAL | Status: DC
  Administered 2012-06-23 – 2012-06-26 (×4): 81 mg via ORAL
  Filled 2012-06-23 (×4): qty 1

## 2012-06-23 MED ORDER — ACETAMINOPHEN 325 MG PO TABS
650.0000 mg | ORAL_TABLET | Freq: Once | ORAL | Status: AC
  Administered 2012-06-23: 650 mg via ORAL
  Filled 2012-06-23: qty 2

## 2012-06-23 MED ORDER — ENOXAPARIN SODIUM 40 MG/0.4ML ~~LOC~~ SOLN
40.0000 mg | SUBCUTANEOUS | Status: DC
  Administered 2012-06-23 – 2012-06-25 (×3): 40 mg via SUBCUTANEOUS
  Filled 2012-06-23 (×5): qty 0.4

## 2012-06-23 MED ORDER — SODIUM CHLORIDE 0.9 % IJ SOLN
3.0000 mL | INTRAMUSCULAR | Status: AC
  Administered 2012-06-23: 10 mL via INTRAVENOUS

## 2012-06-23 MED ORDER — ONDANSETRON HCL 4 MG/2ML IJ SOLN: 4.0000 mg | Freq: Four times a day (QID) | INTRAMUSCULAR | Status: DC | PRN

## 2012-06-23 MED ORDER — BUPROPION HCL ER (SR) 100 MG PO TB12: 200.0000 mg | ORAL_TABLET | Freq: Two times a day (BID) | ORAL | Status: DC

## 2012-06-23 MED ORDER — ALUM & MAG HYDROXIDE-SIMETH 200-200-20 MG/5ML PO SUSP: 30.0000 mL | Freq: Four times a day (QID) | ORAL | Status: DC | PRN

## 2012-06-23 MED ORDER — LORAZEPAM 1 MG PO TABS
1.0000 mg | ORAL_TABLET | Freq: Every day | ORAL | Status: DC
  Administered 2012-06-23 – 2012-06-25 (×3): 1 mg via ORAL
  Filled 2012-06-23 (×3): qty 1

## 2012-06-23 MED ORDER — ADULT MULTIVITAMIN W/MINERALS CH
1.0000 | ORAL_TABLET | Freq: Every day | ORAL | Status: DC
  Administered 2012-06-23 – 2012-06-26 (×4): 1 via ORAL
  Filled 2012-06-23 (×4): qty 1

## 2012-06-23 MED ORDER — OXYBUTYNIN CHLORIDE 5 MG PO TABS
5.0000 mg | ORAL_TABLET | Freq: Two times a day (BID) | ORAL | Status: DC
  Administered 2012-06-23 – 2012-06-26 (×7): 5 mg via ORAL
  Filled 2012-06-23 (×9): qty 1

## 2012-06-23 MED ORDER — ACETAMINOPHEN 500 MG PO TABS
500.0000 mg | ORAL_TABLET | Freq: Two times a day (BID) | ORAL | Status: DC
  Administered 2012-06-23 – 2012-06-26 (×8): 500 mg via ORAL
  Filled 2012-06-23 (×10): qty 1

## 2012-06-23 MED ORDER — METOPROLOL TARTRATE 12.5 MG HALF TABLET
12.5000 mg | ORAL_TABLET | Freq: Two times a day (BID) | ORAL | Status: DC
  Administered 2012-06-23 – 2012-06-26 (×7): 12.5 mg via ORAL
  Filled 2012-06-23 (×9): qty 1

## 2012-06-23 MED ORDER — SODIUM CHLORIDE 0.9 % IV SOLN
INTRAVENOUS | Status: DC
Start: 2012-06-23 — End: 2012-06-25

## 2012-06-23 MED ORDER — BUPROPION HCL ER (SR) 100 MG PO TB12
200.0000 mg | ORAL_TABLET | Freq: Two times a day (BID) | ORAL | Status: DC
  Administered 2012-06-23 – 2012-06-26 (×7): 200 mg via ORAL
  Filled 2012-06-23 (×9): qty 2

## 2012-06-23 MED ORDER — HYDROCODONE-ACETAMINOPHEN 5-325 MG PO TABS: 1.0000 | ORAL_TABLET | ORAL | Status: DC | PRN

## 2012-06-23 MED ORDER — SODIUM CHLORIDE 0.9 % IV SOLN
INTRAVENOUS | Status: DC
  Administered 2012-06-23: 09:00:00 via INTRAVENOUS

## 2012-06-23 MED ORDER — ONDANSETRON HCL 4 MG PO TABS: 4.0000 mg | ORAL_TABLET | Freq: Four times a day (QID) | ORAL | Status: DC | PRN

## 2012-06-23 MED ORDER — VENLAFAXINE HCL ER 75 MG PO CP24
75.0000 mg | ORAL_CAPSULE | Freq: Every day | ORAL | Status: DC
  Administered 2012-06-23 – 2012-06-26 (×4): 75 mg via ORAL
  Filled 2012-06-23 (×4): qty 1

## 2012-06-23 MED ORDER — DOCUSATE SODIUM 100 MG PO CAPS
100.0000 mg | ORAL_CAPSULE | Freq: Two times a day (BID) | ORAL | Status: DC
  Administered 2012-06-23 – 2012-06-26 (×7): 100 mg via ORAL
  Filled 2012-06-23 (×9): qty 1

## 2012-06-23 MED ORDER — MULTIVITAMINS PO TABS: 1.0000 | ORAL_TABLET | Freq: Every day | ORAL | Status: DC

## 2012-06-23 NOTE — ED Notes (Signed)
Patient denies pain and is resting comfortably.  

## 2012-06-23 NOTE — H&P (Signed)
Patient seen and examined with Stephani Police, PA

## 2012-06-23 NOTE — ED Notes (Signed)
Family at bedside. 

## 2012-06-23 NOTE — ED Notes (Signed)
Per EMS pt fell yesterday landing on left side. Pt c/o left leg and knee pain. Pt denies head injury. Pt able to move all extremities.

## 2012-06-23 NOTE — ED Notes (Signed)
Pt returned from xr

## 2012-06-23 NOTE — Clinical Social Work Psychosocial (Signed)
Clinical Social Work Department BRIEF PSYCHOSOCIAL ASSESSMENT 06/23/2012  Patient:  Vickie Hoffman, Vickie Hoffman     Account Number:  [id number]     Admit date:  06/23/2012  Clinical Social Worker:  Thomasene Mohair  Date/Time:  06/23/2012 11:30 AM  Referred by:  Physician  Date Referred:  06/23/2012 Referred for  SNF Placement   Other Referral:   Interview type:  Family Other interview type:    PSYCHOSOCIAL DATA Living Status:  HUSBAND Admitted from facility:   Level of care:   Primary support name:  Tramya Schoenfelder Primary support relationship to patient:  SPOUSE Degree of support available:   Strong support.  SonRiley Lam [phone] (has landscaping business name on voicemail) and daugther-in-law Jazelle Achey [phone] (black box, HR staff)  Son Sherryle Lis    CURRENT CONCERNS Current Concerns  Post-Acute Placement  Adjustment to Illness   Other Concerns:   Family lost middle son Dannielle Huh to cancer in 12-Feb-2012. He died 16 days from diagnoses, Pt's husband tearful when discussing loss.    SOCIAL WORK ASSESSMENT / PLAN CSW was referred to Pt d/t hip fracture diagnosed in the ED. Pt resting during CSW assessment. Pt's spouse Jamesha Ellsworth and son Riley Lam were at bedside. CSW discussed with them reason for CSW involvement in treatment and discharge planning. Family explained that Pt fell in November and went to [facility] until Feb 12, 2012. Pt discharged home with home health and now has an aid who comes 3 days a week for 5 hours a day to take care of bathing and allow for Pt's spouse to participate in activities outside the home.  This admission, Pt fell while spouse stepped outside and she was transferring from a wheelchair. Pt's son Gala Romney reports that Pt does have a hospital bed and a 3-in-1 in the home at this time. CSW advised that CSW will continue to follow up, but that the recommendation might be to return to SNF before returning home. Family is agreeable to SNF if recommended  by treatment team.  Unit CSW will continue to follow up to assist with potential placement.   Assessment/plan status:  Psychosocial Support/Ongoing Assessment of Needs Other assessment/ plan:   Information/referral to community resources:    PATIENT'S/FAMILY'S RESPONSE TO PLAN OF CARE: Pt's husband is 66 years old, very active in Pt's care and activities outside of the home. Pt's husband discussed losing their middle son to cancer in February 16 days after diagnosis. He was tearful in discussing the loss multiple times during the short assessment. Recent loss of son could impact spouse's adjustment to wife's current situation.    Frederico Hamman, LCSW  ED Clinical Social Worker  [phone]

## 2012-06-23 NOTE — ED Provider Notes (Signed)
History     CSN: [id number]  Arrival date & time 06/23/12  0709   First MD Initiated Contact with Patient 06/23/12 [phone]      Chief Complaint  Patient presents with  . Fall  . Leg Pain    (Consider location/radiation/quality/duration/timing/severity/associated sxs/prior treatment) Patient is a 78 y.o. female presenting with fall and leg pain. The history is provided by the patient, the EMS personnel and medical records. No language interpreter was used.  Fall The accident occurred yesterday. Incident: Paramedics say that pt was transferring from one wheelchair to another, and fell.  She has pain in the left leg.  She also has some abdominal pain.  She has dementia, and is unable to give much history or relate a reiview of systems. Distance fallen: Unknown. There was no blood loss. The pain is present in the left hip. The pain is mild. She was not ambulatory at the scene. There was no entrapment after the fall. There was no drug use involved in the accident. There was no alcohol use involved in the accident. Associated symptoms include abdominal pain. Exacerbated by: Movement of left leg. Treatments tried: Transported by EMS to [facility] ED for evaluation.  Leg Pain     Past Medical History  Diagnosis Date  . Dementia     Past Surgical History  Procedure Date  . Compression hip screw 10/03/2011    Procedure: COMPRESSION HIP;  Surgeon: Kathryne Hitch;  Location: [facility] OR;  Service: Orthopedics;  Laterality: Right;    History reviewed. No pertinent family history.  History  Substance Use Topics  . Smoking status: Never Smoker   . Smokeless tobacco: Not on file  . Alcohol Use: No    OB History    Grav Para Term Preterm Abortions TAB SAB Ect Mult Living                  Review of Systems  Unable to perform ROS: Dementia  Gastrointestinal: Positive for abdominal pain.    Allergies  Statins and Sulfa antibiotics  Home Medications   Current Outpatient Rx  Name  Route Sig Dispense Refill  . ACETAMINOPHEN 500 MG PO TABS Oral Take 500 mg by mouth 2 (two) times daily. 1 in the morning and 1 in the evening    . ASPIRIN 81 MG PO CHEW Oral Chew 81 mg by mouth daily.      . BUPROPION HCL ER (SR) 200 MG PO TB12 Oral Take 200 mg by mouth 2 (two) times daily.      . DONEPEZIL HCL 10 MG PO TABS Oral Take 10 mg by mouth at bedtime.      Marland Kitchen LORAZEPAM 0.5 MG PO TABS Oral Take 1 mg by mouth at bedtime.      . MULTIVITAMINS PO TABS Oral Take 1 tablet by mouth daily.      . OXYBUTYNIN CHLORIDE 5 MG PO TABS Oral Take 5 mg by mouth 2 (two) times daily.      . VENLAFAXINE HCL ER 75 MG PO CP24 Oral Take 75 mg by mouth daily.      Marland Kitchen METOPROLOL TARTRATE 12.5 MG HALF TABLET Oral Take 0.5 tablets (12.5 mg total) by mouth 2 (two) times daily. 60 tablet 0    BP 148/65  Pulse 92  Temp 99.1 F (37.3 C) (Oral)  Resp 18  SpO2 98%  Physical Exam  Nursing note and vitals reviewed. Constitutional: She appears well-developed and well-nourished. No distress.  Pleasant elderly woman, no distress at rest.  HENT:  Head: Normocephalic and atraumatic.  Right Ear: External ear normal.  Left Ear: External ear normal.  Mouth/Throat: Oropharynx is clear and moist.  Eyes: Conjunctivae and EOM are normal. Pupils are equal, round, and reactive to light.  Neck: Normal range of motion. Neck supple.  Cardiovascular: Normal rate, regular rhythm and normal heart sounds.   Pulmonary/Chest: Effort normal and breath sounds normal.  Abdominal: Soft. Bowel sounds are normal. She exhibits no distension. There is no tenderness.  Musculoskeletal:       Pt has no visible or palpable deformity of either hip.  ROM of right hip causes some pain.  Neurological: She is alert.       Sensory and motor grossly intact.  Pt is pleasantly demented.  Skin: Skin is warm and dry.  Psychiatric: She has a normal mood and affect. Her behavior is normal.    ED Course  Procedures (including critical care  time)   Labs Reviewed  CBC WITH DIFFERENTIAL  COMPREHENSIVE METABOLIC PANEL  LIPASE, BLOOD  URINALYSIS, ROUTINE W REFLEX MICROSCOPIC  URINE CULTURE  SAMPLE TO BLOOD BANK    Date: 06/23/2012  Rate: 93  Rhythm: normal sinus rhythm  QRS Axis: left  Intervals: QT prolonged QRS:  Left ventricular hypertrophy  ST/T Wave abnormalities: ST depressions laterally  Conduction Disutrbances:left bundle branch block  Narrative Interpretation: Abnormal EKG  Old EKG Reviewed: changes noted--PVC's no longer present since tracing of 10/08/2011.   7:36 AM Pt seen --> physical exam performed.  Old charts reviewed.  Lab workup ordered.  9:55 AM Results for orders placed during the hospital encounter of 06/23/12  CBC WITH DIFFERENTIAL      Component Value Range   WBC 9.5  4.0 - 10.5 K/uL   RBC 3.80 (*) 3.87 - 5.11 MIL/uL   Hemoglobin 11.2 (*) 12.0 - 15.0 g/dL   HCT 16.1 (*) 09.6 - 04.5 %   MCV 88.4  78.0 - 100.0 fL   MCH 29.5  26.0 - 34.0 pg   MCHC 33.3  30.0 - 36.0 g/dL   RDW 40.9  81.1 - 91.4 %   Platelets    150 - 400 K/uL   Value: PLATELET CLUMPS NOTED ON SMEAR, COUNT APPEARS ADEQUATE   Neutrophils Relative 79 (*) 43 - 77 %   Lymphocytes Relative 11 (*) 12 - 46 %   Monocytes Relative 9  3 - 12 %   Eosinophils Relative 1  0 - 5 %   Basophils Relative 0  0 - 1 %   Neutro Abs 7.5  1.7 - 7.7 K/uL   Lymphs Abs 1.0  0.7 - 4.0 K/uL   Monocytes Absolute 0.9  0.1 - 1.0 K/uL   Eosinophils Absolute 0.1  0.0 - 0.7 K/uL   Basophils Absolute 0.0  0.0 - 0.1 K/uL   Smear Review MORPHOLOGY UNREMARKABLE    COMPREHENSIVE METABOLIC PANEL      Component Value Range   Sodium 138  135 - 145 mEq/L   Potassium 4.8  3.5 - 5.1 mEq/L   Chloride 99  96 - 112 mEq/L   CO2 27  19 - 32 mEq/L   Glucose, Bld 119 (*) 70 - 99 mg/dL   BUN 30 (*) 6 - 23 mg/dL   Creatinine, Ser 7.82  0.50 - 1.10 mg/dL   Calcium 9.6  8.4 - 95.6 mg/dL   Total Protein 6.7  6.0 - 8.3 g/dL   Albumin 3.5  3.5 - 5.2 g/dL   AST 31  0 -  37 U/L   ALT 32  0 - 35 U/L   Alkaline Phosphatase 169 (*) 39 - 117 U/L   Total Bilirubin 0.7  0.3 - 1.2 mg/dL   GFR calc non Af Amer 51 (*) >90 mL/min   GFR calc Af Amer 59 (*) >90 mL/min  LIPASE, BLOOD      Component Value Range   Lipase 15  11 - 59 U/L  URINALYSIS, ROUTINE W REFLEX MICROSCOPIC      Component Value Range   Color, Urine YELLOW  YELLOW   APPearance CLEAR  CLEAR   Specific Gravity, Urine 1.025  1.005 - 1.030   pH 7.0  5.0 - 8.0   Glucose, UA NEGATIVE  NEGATIVE mg/dL   Hgb urine dipstick NEGATIVE  NEGATIVE   Bilirubin Urine NEGATIVE  NEGATIVE   Ketones, ur NEGATIVE  NEGATIVE mg/dL   Protein, ur NEGATIVE  NEGATIVE mg/dL   Urobilinogen, UA 0.2  0.0 - 1.0 mg/dL   Nitrite NEGATIVE  NEGATIVE   Leukocytes, UA NEGATIVE  NEGATIVE  SAMPLE TO BLOOD BANK      Component Value Range   Blood Bank Specimen SAMPLE AVAILABLE FOR TESTING     Sample Expiration 06/24/2012     Dg Hip Complete Left  06/23/2012  *RADIOLOGY REPORT*  Clinical Data: Fall with left leg pain.  LEFT HIP - COMPLETE 2+ VIEW  Comparison: Acute abdominal series 06/23/2012. Pelvis and right hip radiographs 03/22/2012  Findings: The bones are markedly osteopenic, which may decrease the sensitivity for detecting nondisplaced fractures.  There is irregularity in the contour of the right superior pubic ramus medially, new compared to prior studies, and compatible with an acute nondisplaced fracture. There is no diastasis.  The left hip is located.  No acute fracture of the proximal left femur is identified.  There are postsurgical changes of dynamic hip screw and intramedullary rod in the proximal right femur.  The distal aspect of the intramedullary rod is not completely visualized.  No acute bony abnormality of the right hip. Heterotopic ossification lateral to the right femoral head is stable.  The pelvic ring is intact.  No acute fracture the bony pelvis is identified.  The  IMPRESSION:  1.  Acute nondisplaced fracture of  the right superior pubic ramus. 2.  Markedly osteopenic bones. 3.  Stable postoperative changes of the proximal right femur.  Original Report Authenticated By: Britta Mccreedy, M.D.   Dg Abd Acute W/chest  06/23/2012  *RADIOLOGY REPORT*  Clinical Data: Fall with abdominal pain.  ACUTE ABDOMEN SERIES (ABDOMEN 2 VIEW & CHEST 1 VIEW)  Comparison: Chest radiograph on 10/03/2011  Findings: Chest radiograph demonstrates a retrocardiac density which is suggestive for a hiatal hernia.  Otherwise, the lungs are clear.  Heart size is upper limits of normal but unchanged.  No evidence for free air.  There is bowel gas within the small and large bowel.  Moderate amount of stool in the abdomen.  Gas-filled loop of small bowel in the right abdomen measures 2.7 cm.  The patient has had internal fixation of the right hip.  Multilevel degenerative changes in the lumbar spine. There is a fracture involving the right superior pubic ramus and there may be cortical disruption near the right acetabulum.  IMPRESSION: Right pubic ramus fracture.  Nonspecific bowel gas pattern.  Moderate amount of stool.  No acute chest findings.  Original Report Authenticated By: Richarda Overlie, M.D.  10:16 AM Discussed with Georgann Housekeeper, PA-C.  Admit to med surg bed to [facility], Team 10.   1. Pelvic fracture   2. Dementia            Carleene Cooper III, MD 06/23/12 1017

## 2012-06-23 NOTE — ED Notes (Signed)
Pt will not be admitted to [facility], we are waiting for bed placement, family updated

## 2012-06-23 NOTE — H&P (Signed)
[facility] History and Physical  Vickie Hoffman UJW:[id number] DOB: 1920-04-01 DOA: 06/23/2012  Referring physician: Dr. Ignacia Palma PCP: Georgann Housekeeper, MD   Chief Complaint: right hip pain  HPI: 78 year old Caucasian female with history of dementia, depression and anxiety, and previous right hip fracture is brought to the emergency department today with right hip pain. Yesterday she was attempting to transfer from her wheelchair to a regular chair and fell.  She began complaining of right hip pain and did not eat last night. When her family relies this morning that she was hurt she was brought to the emergency department. The patient herself reports that she has been feeling poorly for some time. The family interrupts my history and tells me that the patient is incapable of giving an accurate history secondary to her dementia. Her Vickie Hoffman, Vickie Hoffman, tells me that she has had no recent illness, she was eating well prior to last evening, she's been having regular bowel movements, urinating well, not complaining of pain (until the fall), she has a chronic dry cough felt to be related to allergies, but no difficulty breathing.    Approximately 2 years ago she fell and broke her right hip. Dr. Magnus Ivan placed a compression screw. She went through rehabilitation at [facility], and then returned to home to live with her 78 year old Vickie Hoffman and a part-time home health aide. The family was happy with [facility] and would like to return there if rehabilitation is needed. Since the time of her last hip fracture she has been mostly wheelchair bound. However her aid would get her up to go to the bathroom.  Per her family she is a full code.  Her Vickie-in-Hoffman works in [employer].  Review of Systems:  Pertinent positives include:  Right hip pain.  Recent loss of appetite.  Poor balance.   Pertinent negatives include:  Per HPI.  All other systems reviewed and found to be  negative.   Past Medical History  Diagnosis Date  . Dementia   . Anxiety and depression    Past Surgical History  Procedure Date  . Compression hip screw 10/03/2011    Procedure: COMPRESSION HIP;  Surgeon: Kathryne Hitch;  Location: [facility] OR;  Service: Orthopedics;  Laterality: Right;  . Fracture surgery    Social History:  reports that she has never smoked. She does not have any smokeless tobacco history on file. She reports that she does not drink alcohol or use illicit drugs.  Allergies  Allergen Reactions  . Statins   . Sulfa Antibiotics     History reviewed. No pertinent family history.   Prior to Admission medications   Medication Sig Start Date End Date Taking? Authorizing Provider  acetaminophen (TYLENOL) 500 MG tablet Take 500 mg by mouth 2 (two) times daily. 1 in the morning and 1 in the evening   Yes Historical Provider, MD  aspirin 81 MG chewable tablet Chew 81 mg by mouth daily.     Yes Historical Provider, MD  buPROPion (WELLBUTRIN SR) 200 MG 12 hr tablet Take 200 mg by mouth 2 (two) times daily.     Yes Historical Provider, MD  donepezil (ARICEPT) 10 MG tablet Take 10 mg by mouth at bedtime.     Yes Historical Provider, MD  LORazepam (ATIVAN) 0.5 MG tablet Take 1 mg by mouth at bedtime.     Yes Historical Provider, MD  multivitamin [facility]) per tablet Take 1 tablet by mouth daily.     Yes Historical Provider,  MD  oxybutynin (DITROPAN) 5 MG tablet Take 5 mg by mouth 2 (two) times daily.     Yes Historical Provider, MD  venlafaxine (EFFEXOR-XR) 75 MG 24 hr capsule Take 75 mg by mouth daily.     Yes Historical Provider, MD  metoprolol tartrate (LOPRESSOR) 12.5 mg TABS Take 0.5 tablets (12.5 mg total) by mouth 2 (two) times daily. 10/09/11   Kathryne Hitch, MD   Physical Exam: Filed Vitals:   06/23/12 1000 06/23/12 1100 06/23/12 1113 06/23/12 1132  BP: 160/68 153/57 153/57 153/57  Pulse: 97 99 99 97  Temp:      TempSrc:      Resp: 22 20 18 20    SpO2: 96% 94% 95% 94%     General:  No Acute Distress, Awake, Non toxic, thin  Eyes: Pupils equal, round and reactive to light  Neck: Supple  Cardiovascular:  Displaced to the left.  Regular rate and rhythm, no murmurs, rubs or gallops, no lower extremity edema.  Visible right sided jugular venous pulsations  Respiratory: Clear to auscultation, no accessory muscle movment  Abdomen: Soft, Mildly tender to palpation on the right, non distended, positive bowel sounds, no obvious masses palpated.  Skin: no rashes, bruises, or lesions  Psychiatric: Alert and Orientated, Cooperative, Well groomed (Per family the history she was giving was not correct)  Neurologic: Cranial Nerves 2-12 are grossly in tact.  Exam non focal.  Labs on Admission:  Basic Metabolic Panel:  Lab 06/23/12 9629  NA 138  K 4.8  CL 99  CO2 27  GLUCOSE 119*  BUN 30*  CREATININE 0.96  CALCIUM 9.6  MG --  PHOS --   Liver Function Tests:  Lab 06/23/12 0724  AST 31  ALT 32  ALKPHOS 169*  BILITOT 0.7  PROT 6.7  ALBUMIN 3.5    Lab 06/23/12 0724  LIPASE 15  AMYLASE --   CBC:  Lab 06/23/12 0724  WBC 9.5  NEUTROABS 7.5  HGB 11.2*  HCT 33.6*  MCV 88.4  PLT PLATELET CLUMPS NOTED ON SMEAR, COUNT APPEARS ADEQUATE   Cardiac Enzymes: No results found for this basename: CKTOTAL:5,CKMB:5,CKMBINDEX:5,TROPONINI:5 in the last 168 hours  Radiological Exams on Admission: Dg Hip Complete Left  06/23/2012  *RADIOLOGY REPORT*  Clinical Data: Fall with left leg pain.  LEFT HIP - COMPLETE 2+ VIEW  Comparison: Acute abdominal series 06/23/2012. Pelvis and right hip radiographs 03/22/2012  Findings: The bones are markedly osteopenic, which may decrease the sensitivity for detecting nondisplaced fractures.  There is irregularity in the contour of the right superior pubic ramus medially, new compared to prior studies, and compatible with an acute nondisplaced fracture. There is no diastasis.  The left hip is  located.  No acute fracture of the proximal left femur is identified.  There are postsurgical changes of dynamic hip screw and intramedullary rod in the proximal right femur.  The distal aspect of the intramedullary rod is not completely visualized.  No acute bony abnormality of the right hip. Heterotopic ossification lateral to the right femoral head is stable.  The pelvic ring is intact.  No acute fracture the bony pelvis is identified.  The  IMPRESSION:  1.  Acute nondisplaced fracture of the right superior pubic ramus. 2.  Markedly osteopenic bones. 3.  Stable postoperative changes of the proximal right femur.  Original Report Authenticated By: Britta Mccreedy, M.D.   Dg Abd Acute W/chest  06/23/2012  *RADIOLOGY REPORT*  Clinical Data: Fall with abdominal pain.  ACUTE  ABDOMEN SERIES (ABDOMEN 2 VIEW & CHEST 1 VIEW)  Comparison: Chest radiograph on 10/03/2011  Findings: Chest radiograph demonstrates a retrocardiac density which is suggestive for a hiatal hernia.  Otherwise, the lungs are clear.  Heart size is upper limits of normal but unchanged.  No evidence for free air.  There is bowel gas within the small and large bowel.  Moderate amount of stool in the abdomen.  Gas-filled loop of small bowel in the right abdomen measures 2.7 cm.  The patient has had internal fixation of the right hip.  Multilevel degenerative changes in the lumbar spine. There is a fracture involving the right superior pubic ramus and there may be cortical disruption near the right acetabulum.  IMPRESSION: Right pubic ramus fracture.  Nonspecific bowel gas pattern.  Moderate amount of stool.  No acute chest findings.  Original Report Authenticated By: Richarda Overlie, M.D.    EKG: Independently reviewed. Patient appears to have right and left BBB.  Not a significant change from 2011.  Assessment/Plan Active Problems:  Fracture Of Superior Pubic Ramus  Dementia  Anxiety and depression  HTN (hypertension)  Bundle branch  block    Right Superior Ramus Fracture.  Minimally displaced.  Bed rest, pain control, Physical therapy, Occupational therapy.  I have called Dr. Eliberto Ivory office to ask him to review the images to ensure we are doing everything we can.  Bundle Branch Block.  Not significantly changed from 2011 on EKG, but will cycle enzymes and place her on telemetry for 24 hours.  Anxiety and Depression.  Continue home medications.  Dementia.  Appears A&O for the most part, but according to family she is not.  Not currently on medication for dementia.  HTN.  Appears controlled.  Will decrease IVF and continue home medications.   Code Status: Full Code.  I discussed reconsidering code status with Vickie Hoffman, Vickie Hoffman.  He says this is what his father wants. A Palliative medicine Goals of care consult may be helpful.  Per family she has been on Hospice in the past, but due to a massive billing error (in which they declared her dead and none of her bills were paid) they do not want to use Hospice in the future. Family Communication: Vickie Hoffman and Vickie Hoffman at bedside.  Vickie Hoffman works in [employer]. Disposition Plan: Likely SNF Rehab when medically ready.  Algis Downs, PA-C [facility] Pager: [phone] [facility] Pager [phone]  If 7PM-7AM, please contact night-coverage [email or web address] Password [facility] 06/23/2012, 11:40 AM  Dr. Kirtland Bouchard. Hussain's office Deboraha Sprang at [place]) will assume care at 7:00 am on 8/1.

## 2012-06-23 NOTE — ED Notes (Signed)
Per family pt fell out of her wheel chair yesterday while attempting to move into a regular sitting chair in the living room yesterday approx 1600. Pt fell landing on her (L) side, per family pt reported pain to inner (R) thigh and (L) hip pain through out the night. Family denies pt hit her head, pt denies any pain, moves all extremities.

## 2012-06-23 NOTE — ED Notes (Signed)
Patient transported to X-ray 

## 2012-06-24 LAB — CARDIAC PANEL(CRET KIN+CKTOT+MB+TROPI)
Relative Index: 2.4 (ref 0.0–2.5)
Troponin I: <0.3 ng/mL (ref <0.30)

## 2012-06-24 LAB — BASIC METABOLIC PANEL
Calcium: 8.9 mg/dL (ref 8.4–10.5)
Chloride: 100 mEq/L (ref 96–112)
Creatinine, Ser: 1.02 mg/dL (ref 0.50–1.10)
GFR calc Af Amer: 55 mL/min — ABNORMAL LOW (ref >90)
Sodium: 136 mEq/L (ref 135–145)

## 2012-06-24 LAB — URINE CULTURE: Colony Count: 15000

## 2012-06-24 LAB — CBC
HCT: 31 % — ABNORMAL LOW (ref 36.0–46.0)
Platelets: 197 10*3/uL (ref 150–400)
RBC: 3.57 MIL/uL — ABNORMAL LOW (ref 3.87–5.11)
RDW: 14.5 % (ref 11.5–15.5)
WBC: 10.8 10*3/uL — ABNORMAL HIGH (ref 4.0–10.5)

## 2012-06-24 MED ORDER — SENNA 8.6 MG PO TABS
1.0000 | ORAL_TABLET | Freq: Every day | ORAL | Status: DC
  Administered 2012-06-24 – 2012-06-25 (×2): 8.6 mg via ORAL
  Filled 2012-06-24 (×3): qty 1

## 2012-06-24 MED ORDER — VITAMIN D3 25 MCG (1000 UNIT) PO TABS
2000.0000 [IU] | ORAL_TABLET | Freq: Every day | ORAL | Status: DC
  Administered 2012-06-24 – 2012-06-26 (×3): 2000 [IU] via ORAL
  Filled 2012-06-24 (×3): qty 2

## 2012-06-24 NOTE — Evaluation (Signed)
Physical Therapy Evaluation Patient Details Name: Vickie Hoffman MRN: [id number] DOB: [birth year] Today's Date: 06/24/2012 Time: 4098-1191 PT Time Calculation (min): 25 min  PT Assessment / Plan / Recommendation Clinical Impression  pt adm with pubic rami fx s/p fall while attempting a transfer.  Mobility limited by weakness and pain and pt would benefit from rehab at SNF level if she qualifies.  Otherwise, whe will need 24 hour assist and help mobilizing.    PT Assessment  Patient needs continued PT services    Follow Up Recommendations  Skilled nursing facility;Supervision/Assistance - 24 hour    Barriers to Discharge Other (comment) (? does her family have enough help at home)      Equipment Recommendations  Defer to next venue    Recommendations for Other Services     Frequency Min 3X/week    Precautions / Restrictions Precautions Precautions: Fall Restrictions Weight Bearing Restrictions: Yes (WBAT)   Pertinent Vitals/Pain       Mobility  Bed Mobility Bed Mobility: Supine to Sit;Sit to Supine;Rolling Left Rolling Left: 4: Min assist Supine to Sit: 1: +2 Total assist;HOB flat Supine to Sit: Patient Percentage: 50% Sit to Supine: 1: +2 Total assist;HOB flat Sit to Supine: Patient Percentage: 40% Details for Bed Mobility Assistance: vc's for direction and calming anxiety; truncal and extremity assist/support Transfers Transfers: Sit to Stand;Stand to Sit Sit to Stand: 1: +2 Total assist;From bed Sit to Stand: Patient Percentage: 50% Stand to Sit: 1: +2 Total assist;To bed Stand to Sit: Patient Percentage: 50% Details for Transfer Assistance: vc's for guidance through task and decr anxiety;  lifting and stability assist Ambulation/Gait Ambulation/Gait Assistance: Not tested (comment) (pt unable to bear weight unilaterally to take a step) Stairs: No Wheelchair Mobility Wheelchair Mobility: No    Exercises     PT Diagnosis: Difficulty walking;Generalized  weakness;Acute pain  PT Problem List: Decreased strength;Decreased activity tolerance;Decreased balance;Decreased mobility;Pain;Decreased safety awareness;Decreased knowledge of use of DME;Decreased cognition PT Treatment Interventions: DME instruction;Gait training;Functional mobility training;Therapeutic activities;Balance training;Patient/family education   PT Goals Acute Rehab PT Goals PT Goal Formulation: Patient unable to participate in goal setting Time For Goal Achievement: 07/01/12 Potential to Achieve Goals: Fair Pt will go Supine/Side to Sit: with min assist;with HOB 0 degrees PT Goal: Supine/Side to Sit - Progress: Goal set today Pt will go Sit to Stand: with min assist PT Goal: Sit to Stand - Progress: Goal set today Pt will Transfer Bed to Chair/Chair to Bed: with mod assist PT Transfer Goal: Bed to Chair/Chair to Bed - Progress: Goal set today Pt will Ambulate: 1 - 15 feet;with mod assist;with least restrictive assistive device PT Goal: Ambulate - Progress: Goal set today  Visit Information  Last PT Received On: 06/24/12 Assistance Needed: +2    Subjective Data  Subjective: I can't do anything without my husband Patient Stated Goal: walking   Prior Functioning  Home Living Lives With: Spouse (pieced together from notes) Available Help at Discharge: Family;Other (Comment) (husband, son daughter  (per notes)) Communication Communication: No difficulties    Cognition  Overall Cognitive Status: History of cognitive impairments - at baseline Arousal/Alertness: Awake/alert Orientation Level: Disoriented to;Place;Time;Situation Behavior During Session: Anxious    Extremity/Trunk Assessment Right Lower Extremity Assessment RLE ROM/Strength/Tone: Deficits;Unable to fully assess;Due to pain RLE ROM/Strength/Tone Deficits: painful with flexion and adduction (around site of fx) grossly 3+/5 bil Left Lower Extremity Assessment LLE ROM/Strength/Tone: Unable to fully  assess;Due to pain   Balance Balance Balance Assessed: Yes Static Sitting  Balance Static Sitting - Balance Support: Right upper extremity supported;Left upper extremity supported;Feet supported Static Sitting - Level of Assistance: 5: Stand by assistance  End of Session PT - End of Session Activity Tolerance: Patient limited by pain Patient left: in bed;with call bell/phone within reach Nurse Communication: Mobility status  GP     Tiffini Blacksher, Eliseo Gum 06/24/2012, 4:30 PM  06/24/2012  [place], PT [phone] (pager)

## 2012-06-24 NOTE — Progress Notes (Signed)
Patient transferred to [facility] [place] and report provided by Catha Gosselin, LCSW.  Patient discussed with [facility]- Doristine Counter and Darl Pikes.  It does not appear that patient will be approved for inpatient stay needed for placement.  Will monitor in the a.m.  Discussed above with pt's son Gala Romney who verbalized understanding.  Referral completed to [facility] in case patient will meet inpatient criteria.  Son states that patient and his father cannot afford private pay at [facility] and thus would have to take patient home if unable to place in SNF.    Lorri Frederick. [place]  [phone]

## 2012-06-24 NOTE — Progress Notes (Signed)
Subjective: Pt c/o pain in right hip are. Confused pleasant Advance dementia   Objective: Vital signs in last 24 hours: Temp:  [97.9 F (36.6 C)-98.5 F (36.9 C)] 98.5 F (36.9 C) (08/01 0500) Pulse Rate:  [84-99] 84  (08/01 0500) Resp:  [17-22] 18  (08/01 0500) BP: (133-168)/(57-82) 160/82 mmHg (08/01 0500) SpO2:  [93 %-96 %] 95 % (08/01 0500) Weight:  [62 kg (136 lb 11 oz)] 62 kg (136 lb 11 oz) (07/31 2211) Weight change:  Last BM Date: 06/22/12  Intake/Output from previous day: 07/31 0701 - 08/01 0700 In: 510 [I.V.:510] Out: 2 [Urine:2] Intake/Output this shift:    General appearance: alert Resp: clear to auscultation bilaterally Cardio: regular rate and rhythm GI: soft, non-tender; bowel sounds normal; no masses,  no organomegaly  Lab Results:  [facility] 06/24/12 0346 06/23/12 1456  WBC 10.8* 9.1  HGB 10.5* 10.3*  HCT 31.0* 30.7*  PLT 197 170   BMET  Basename 06/24/12 0346 06/23/12 1456 06/23/12 0724  NA 136 -- 138  K 4.0 -- 4.8  CL 100 -- 99  CO2 23 -- 27  GLUCOSE 98 -- 119*  BUN 27* -- 30*  CREATININE 1.02 1.00 --  CALCIUM 8.9 -- 9.6    Studies/Results: Dg Hip Complete Left  06/23/2012  *RADIOLOGY REPORT*  Clinical Data: Fall with left leg pain.  LEFT HIP - COMPLETE 2+ VIEW  Comparison: Acute abdominal series 06/23/2012. Pelvis and right hip radiographs 03/22/2012  Findings: The bones are markedly osteopenic, which may decrease the sensitivity for detecting nondisplaced fractures.  There is irregularity in the contour of the right superior pubic ramus medially, new compared to prior studies, and compatible with an acute nondisplaced fracture. There is no diastasis.  The left hip is located.  No acute fracture of the proximal left femur is identified.  There are postsurgical changes of dynamic hip screw and intramedullary rod in the proximal right femur.  The distal aspect of the intramedullary rod is not completely visualized.  No acute bony abnormality of  the right hip. Heterotopic ossification lateral to the right femoral head is stable.  The pelvic ring is intact.  No acute fracture the bony pelvis is identified.  The  IMPRESSION:  1.  Acute nondisplaced fracture of the right superior pubic ramus. 2.  Markedly osteopenic bones. 3.  Stable postoperative changes of the proximal right femur.  Original Report Authenticated By: Britta Mccreedy, M.D.   Dg Abd Acute W/chest  06/23/2012  *RADIOLOGY REPORT*  Clinical Data: Fall with abdominal pain.  ACUTE ABDOMEN SERIES (ABDOMEN 2 VIEW & CHEST 1 VIEW)  Comparison: Chest radiograph on 10/03/2011  Findings: Chest radiograph demonstrates a retrocardiac density which is suggestive for a hiatal hernia.  Otherwise, the lungs are clear.  Heart size is upper limits of normal but unchanged.  No evidence for free air.  There is bowel gas within the small and large bowel.  Moderate amount of stool in the abdomen.  Gas-filled loop of small bowel in the right abdomen measures 2.7 cm.  The patient has had internal fixation of the right hip.  Multilevel degenerative changes in the lumbar spine. There is a fracture involving the right superior pubic ramus and there may be cortical disruption near the right acetabulum.  IMPRESSION: Right pubic ramus fracture.  Nonspecific bowel gas pattern.  Moderate amount of stool.  No acute chest findings.  Original Report Authenticated By: Richarda Overlie, M.D.    Medications: I have reviewed the patient's current medications.  Assessment/Plan: Right Superior Ramus Fracture Pain control- vicodin PT/OT Dementia: Advance- pleasant confused- continue Aricept HTn- continue Metoprolol Constipation: colace and senokot Osteoporosis- Vit D 2000 unit daily Depression / anxiety- ativan and Effexor; Wellbutrin Anemia- stable CKD- stable Social work- for SNF placement-  Will get her to [facility]     LOS: 1 day   Jerell Demery 06/24/2012, 7:39 AM

## 2012-06-24 NOTE — Progress Notes (Signed)
CSW spoke with pt and pt son regarding pt transferring to unit 5[facility]. Patient was very confused and did not remember having a son at first. Pt stated that she lived where she always has with all of her family. CSW asked pt if she could speak with pt son, Gala Romney. Pt stated yes, but that her son was very busy so csw may not reach pt son. Pt son stated that patient had just returned home in February from [facility] in February. Pt spouse is active in pt care as well as pt son and daughter. Pt son shared that patient has "spells of confusion" and can have severe signs of dementia at times however medications have been tweaked to assist when patient gets anxious due to confusion.  CSW informed unit csw of potential needs. At this time, csw is unsure if patient will qualify for skilled nursing placement at this time. If patient qualifies patient family would like pt to return to [facility] if skilled nursing is recommended.   Please contact unit [facility] CSW for further csw needs.   Catha Gosselin, Theresia Majors  [phone] .06/24/2012 9:50am

## 2012-06-24 NOTE — Evaluation (Signed)
Occupational Therapy Evaluation Patient Details Name: Vickie Hoffman MRN: [id number] DOB: February 12, 1920 Today's Date: 06/24/2012 Time: 1308-6578 OT Time Calculation (min): 25 min  OT Assessment / Plan / Recommendation Clinical Impression  This 78 y.o. admitted after a fall at home and sustained a pubic rami fracture.  Pt. with history of advanced dementia and no family present during eval to determine pt's baseline function with BADLs.  Feel she will benefit from OT to maximize safety and independence with BADLs.  Anticipate she will require mod A - max A for BADLs at discharge. Would ultimately benefit from SNF for rehab    OT Assessment  Patient needs continued OT Services    Follow Up Recommendations  Skilled nursing facility;Supervision/Assistance - 24 hour    Barriers to Discharge None Unsure if family can provide level of assist  Equipment Recommendations  Defer to next venue    Recommendations for Other Services    Frequency  Min 2X/week    Precautions / Restrictions Precautions Precautions: Fall Restrictions Weight Bearing Restrictions: Yes RLE Weight Bearing: Weight bearing as tolerated LLE Weight Bearing: Weight bearing as tolerated       ADL  Eating/Feeding: Simulated;Set up Where Assessed - Eating/Feeding: Bed level Grooming: Simulated;Wash/dry hands;Wash/dry face;Brushing hair;Minimal assistance Where Assessed - Grooming: Unsupported sitting Upper Body Bathing: Simulated;Minimal assistance;Moderate assistance (variable due to dementia) Where Assessed - Upper Body Bathing: Unsupported sitting Lower Body Bathing: Simulated;Moderate assistance Where Assessed - Lower Body Bathing: Supported sit to stand Upper Body Dressing: Simulated;Minimal assistance;Moderate assistance (variable to due dementia) Where Assessed - Upper Body Dressing: Unsupported sitting Lower Body Dressing: Simulated;Performed;Moderate assistance (socks) Where Assessed - Lower Body Dressing:  Supported sit to stand Toilet Transfer: Chief of Staff: Patient Percentage: 50% Statistician Method: Sit to Barista: Materials engineer and Hygiene: Simulated;+1 Total assistance Where Assessed - Engineer, mining and Hygiene: Standing Equipment Used: Rolling walker Transfers/Ambulation Related to ADLs: sit to stand with total A + 2 (pt. 50%). Pt. unable to advance LEs due to Rt. groin pain ADL Comments: Pt. able to don/doff Lt. sock.  Able to reach to access Rt. ankle, but unable to doff sock due to pain    OT Diagnosis: Generalized weakness;Acute pain;Cognitive deficits  OT Problem List: Decreased strength;Decreased activity tolerance;Impaired balance (sitting and/or standing);Decreased cognition;Decreased safety awareness;Decreased knowledge of use of DME or AE;Pain OT Treatment Interventions: Self-care/ADL training;DME and/or AE instruction;Therapeutic activities;Patient/family education;Balance training;Cognitive remediation/compensation   OT Goals Acute Rehab OT Goals OT Goal Formulation: Patient unable to participate in goal setting Time For Goal Achievement: 07/01/12 Potential to Achieve Goals: Good ADL Goals Pt Will Perform Lower Body Bathing: with min assist;Sit to stand from chair;Sit to stand from bed ADL Goal: Lower Body Bathing - Progress: Goal set today Pt Will Perform Lower Body Dressing: Sit to stand from bed;Sit to stand from chair;with min assist ADL Goal: Lower Body Dressing - Progress: Goal set today Pt Will Transfer to Toilet: with min assist;3-in-1 ADL Goal: Toilet Transfer - Progress: Goal set today Pt Will Perform Toileting - Clothing Manipulation: Standing;with min assist ADL Goal: Toileting - Clothing Manipulation - Progress: Goal set today  Visit Information  Last OT Received On: 06/24/12 Assistance Needed: +2 PT/OT Co-Evaluation/Treatment: Yes      Subjective Data  Subjective: "OH!  that hurst right here"  as she rubs Rt. groin Patient Stated Goal: Pt. unable   Prior Functioning  Vision/Perception  Home Living Lives With: Spouse (per  chart review) Available Help at Discharge: Family;Available 24 hours/day (per chart review, but unsure if they can provide level of A) Additional Comments: Pt. unable to provide information Prior Function Level of Independence:  (Pt. unable to provide information) Communication Communication: No difficulties      Cognition  Overall Cognitive Status: History of cognitive impairments - at baseline Arousal/Alertness: Awake/alert Orientation Level: Disoriented to;Place;Time;Situation Behavior During Session: Anxious Cognition - Other Comments: Pt. with h/o advanced dementia    Extremity/Trunk Assessment Right Upper Extremity Assessment RUE ROM/Strength/Tone: Within functional levels RUE Coordination: WFL - gross/fine motor Left Upper Extremity Assessment LUE ROM/Strength/Tone: Within functional levels LUE Coordination: WFL - gross/fine motor Right Lower Extremity Assessment RLE ROM/Strength/Tone: Deficits;Unable to fully assess;Due to pain RLE ROM/Strength/Tone Deficits: painful with flexion and adduction (around site of fx) grossly 3+/5 bil Left Lower Extremity Assessment LLE ROM/Strength/Tone: Unable to fully assess;Due to pain Trunk Assessment Trunk Assessment: Normal   Mobility Bed Mobility Bed Mobility: Supine to Sit;Sit to Supine;Rolling Left Rolling Left: 4: Min assist Supine to Sit: 1: +2 Total assist;HOB flat Supine to Sit: Patient Percentage: 50% Sit to Supine: 1: +2 Total assist;HOB flat Sit to Supine: Patient Percentage: 40% Details for Bed Mobility Assistance: vc's for direction and calming anxiety; truncal and extremity assist/support Transfers Transfers: Sit to Stand;Stand to Sit Sit to Stand: 1: +2 Total assist;From bed Sit to Stand: Patient Percentage: 50% Stand to  Sit: 1: +2 Total assist;To bed Stand to Sit: Patient Percentage: 50% Details for Transfer Assistance: vc's for guidance through task and decr anxiety;  lifting and stability assist   Exercise    Balance Balance Balance Assessed: Yes Static Sitting Balance Static Sitting - Balance Support: Right upper extremity supported;Left upper extremity supported;Feet supported Static Sitting - Level of Assistance: 5: Stand by assistance  End of Session OT - End of Session Activity Tolerance: Patient limited by pain Patient left: in bed;with call bell/phone within reach Nurse Communication: Mobility status  GO     Jeani Hawking M 06/24/2012, 5:37 PM

## 2012-06-24 NOTE — Progress Notes (Signed)
Patient sating @ 86 on RA. 2LN/C added, patient's oxygen saturations increased to 95%. Will continue to monitor patient.

## 2012-06-25 DIAGNOSIS — E860 Dehydration: Secondary | ICD-10-CM | POA: Diagnosis present

## 2012-06-25 MED ORDER — SENNA 8.6 MG PO TABS: 1.0000 | ORAL_TABLET | Freq: Every day | ORAL | Status: DC

## 2012-06-25 MED ORDER — VITAMIN D3 25 MCG (1000 UNIT) PO TABS: 2000.0000 [IU] | ORAL_TABLET | Freq: Every day | ORAL | Status: DC

## 2012-06-25 MED ORDER — DSS 100 MG PO CAPS: 100.0000 mg | ORAL_CAPSULE | Freq: Two times a day (BID) | ORAL | Status: AC

## 2012-06-25 MED ORDER — LORAZEPAM 0.5 MG PO TABS: 0.5000 mg | ORAL_TABLET | Freq: Four times a day (QID) | ORAL | Status: DC | PRN

## 2012-06-25 MED ORDER — HYDROCODONE-ACETAMINOPHEN 5-325 MG PO TABS: 1.0000 | ORAL_TABLET | ORAL | Status: DC | PRN

## 2012-06-25 NOTE — Progress Notes (Signed)
CARE MANAGEMENT NOTE 06/25/2012  Patient:  CRISTLE, JARED   Account Number:  [id number]  Date Initiated:  06/25/2012  Documentation initiated by:  Vance Peper  Subjective/Objective Assessment:   78 yr old female admitted for pubic rami fracture s/p fall     Action/Plan:   Social Worker was involved with possible SNF placement. Patient will now go home with [facility]. Pt Has dementia. lives with 53 yr old husband. Has family involvement.   Anticipated DC Date:  06/26/2012   Anticipated DC Plan:  HOME W HOME HEALTH SERVICES         Choice offered to / List presented to:             Status of service:  In process, will continue to follow Medicare Important Message given?   (If response is "NO", the following Medicare IM given date fields will be blank) Date Medicare IM given:   Date Additional Medicare IM given:    Discharge Disposition:    Per UR Regulation:    If discussed at Long Length of Stay Meetings, dates discussed:    Comments:  06/25/12 1444 Vance Peper, RN BSN Case Manager Patient's husband: Verdon Cummins [phone] Son: Gala Romney [phone]

## 2012-06-25 NOTE — Discharge Summary (Signed)
Physician Discharge Summary  Patient ID: Vickie Hoffman MRN: [id number] DOB/AGE: 01/02/1920 78 y.o.  Admit date: 06/23/2012 Discharge date: 06/26/2012  Admission Diagnoses:fall with a pelvic fracture, dehydration Discharge Diagnoses:  Active Problems:  Dementia  HTN (hypertension) Sinus tachycardia  Fracture Of Superior Pubic Ramus  Anxiety and depression  Bundle branch block  Dehydration Constipation Anemia Chronic kidney disease   Discharged Condition: good  Hospital Course:  78 years old female with history of dementia, depression anxiety;presented after a fall sustained a pelvic fracture. Problem #1: patient had x-rays done of the hip and pelvic had superior ramus fracture; no surgical intervention, pain control and therapy Patient needs PT OT. Pain control with Vicodin. Problem #2: elevated blood pressure sinus tachycardia; combination of pain; patient was started on low-dose beta blocker her blood pressure was improved as well as her heart rate. Continue on this Problem #3: dehydration elevated BUN/creatinine- IV fluids started improvement with dehydration. Encouraged to take p.o.Marland Kitchen  Chronic kidney disease stable History of anemia of chronic disease hemoglobin stable Anxiety and depression; some anxiety and agitation, lorazepam p.r.n. Continued on her Wellbutrin and Effexor Dementia continue Aricept Constipation continue on Senokot and Colace.  Consults: PTOT  Significant Diagnostic Studies: labs: hemoglobin 10.6. White count normal; blood chemistries elevated BUN/creatinine,improved with IV fluids,pressure normal and radiology: CXR: normal and X-Ray: x-ray of the hip and pelvic showed fracture of the right superior ramus.  Treatments: IV hydration  Discharge Exam: Blood pressure 127/84, pulse 87, temperature 98.7 F (37.1 C), temperature source Oral, resp. rate 18, height 5\' 6"  (1.676 m), weight 62 kg (136 lb 11 oz), SpO2 95.00%. General appearance: alert Resp:  clear to auscultation bilaterally Cardio: regular rate and rhythm GI: soft, non-tender; bowel sounds normal; no masses,  no organomegaly  Disposition: 03-Skilled Nursing Facility   Medication List  As of 06/25/2012  8:04 AM   ASK your doctor about these medications         acetaminophen 500 MG tablet   Commonly known as: TYLENOL   Take 500 mg by mouth 2 (two) times daily. 1 in the morning and 1 in the evening      aspirin 81 MG chewable tablet   Chew 81 mg by mouth daily.      buPROPion 200 MG 12 hr tablet   Commonly known as: WELLBUTRIN SR   Take 200 mg by mouth 2 (two) times daily.      donepezil 10 MG tablet   Commonly known as: ARICEPT   Take 10 mg by mouth at bedtime.      LORazepam 0.5 MG tablet   Commonly known as: ATIVAN   Take 1 mg by mouth at bedtime.      metoprolol tartrate 12.5 mg Tabs   Commonly known as: LOPRESSOR   Take 0.5 tablets (12.5 mg total) by mouth 2 (two) times daily.      multivitamin per tablet   Take 1 tablet by mouth daily.      oxybutynin 5 MG tablet   Commonly known as: DITROPAN   Take 5 mg by mouth 2 (two) times daily.      venlafaxine XR 75 MG 24 hr capsule   Commonly known as: EFFEXOR-XR   Take 75 mg by mouth daily.           discharge planning time total including discussing with the family, medication reconciliation, discharge summary review of labs; 40 minutes.  SignedGeorgann Housekeeper 06/25/2012, 8:04 AM

## 2012-06-25 NOTE — Progress Notes (Signed)
No response yet from pt's son Riley Lam or patient's husband. EMS form completed and placed on chart in case needed for weekend d/c. Nursing notified.  Weekend RNCM will follow up with family. Lorri Frederick. [place]  [phone]

## 2012-06-25 NOTE — Progress Notes (Signed)
Subjective: Intermittent confusion Dehydration improve with IVF Pain with pelvic fx    Objective: Vital signs in last 24 hours: Temp:  [98 F (36.7 C)-99.3 F (37.4 C)] 98.7 F (37.1 C) (08/02 0522) Pulse Rate:  [74-102] 87  (08/02 0522) Resp:  [16-18] 18  (08/02 0522) BP: (127-139)/(45-90) 127/84 mmHg (08/02 0522) SpO2:  [95 %-99 %] 95 % (08/02 0522) Weight change:  Last BM Date: 06/22/12  Intake/Output from previous day: 08/01 0701 - 08/02 0700 In: 1190 [P.O.:240; I.V.:950] Out: -  Intake/Output this shift:    General appearance: alert Resp: clear to auscultation bilaterally Cardio: regular rate and rhythm  Lab Results:  Basename 06/24/12 0346 06/23/12 1456  WBC 10.8* 9.1  HGB 10.5* 10.3*  HCT 31.0* 30.7*  PLT 197 170   BMET  Basename 06/24/12 0346 06/23/12 1456 06/23/12 0724  NA 136 -- 138  K 4.0 -- 4.8  CL 100 -- 99  CO2 23 -- 27  GLUCOSE 98 -- 119*  BUN 27* -- 30*  CREATININE 1.02 1.00 --  CALCIUM 8.9 -- 9.6    Studies/Results: Dg Hip Complete Left  06/23/2012  *RADIOLOGY REPORT*  Clinical Data: Fall with left leg pain.  LEFT HIP - COMPLETE 2+ VIEW  Comparison: Acute abdominal series 06/23/2012. Pelvis and right hip radiographs 03/22/2012  Findings: The bones are markedly osteopenic, which may decrease the sensitivity for detecting nondisplaced fractures.  There is irregularity in the contour of the right superior pubic ramus medially, new compared to prior studies, and compatible with an acute nondisplaced fracture. There is no diastasis.  The left hip is located.  No acute fracture of the proximal left femur is identified.  There are postsurgical changes of dynamic hip screw and intramedullary rod in the proximal right femur.  The distal aspect of the intramedullary rod is not completely visualized.  No acute bony abnormality of the right hip. Heterotopic ossification lateral to the right femoral head is stable.  The pelvic ring is intact.  No acute  fracture the bony pelvis is identified.  The  IMPRESSION:  1.  Acute nondisplaced fracture of the right superior pubic ramus. 2.  Markedly osteopenic bones. 3.  Stable postoperative changes of the proximal right femur.  Original Report Authenticated By: Britta Mccreedy, M.D.   Dg Abd Acute W/chest  06/23/2012  *RADIOLOGY REPORT*  Clinical Data: Fall with abdominal pain.  ACUTE ABDOMEN SERIES (ABDOMEN 2 VIEW & CHEST 1 VIEW)  Comparison: Chest radiograph on 10/03/2011  Findings: Chest radiograph demonstrates a retrocardiac density which is suggestive for a hiatal hernia.  Otherwise, the lungs are clear.  Heart size is upper limits of normal but unchanged.  No evidence for free air.  There is bowel gas within the small and large bowel.  Moderate amount of stool in the abdomen.  Gas-filled loop of small bowel in the right abdomen measures 2.7 cm.  The patient has had internal fixation of the right hip.  Multilevel degenerative changes in the lumbar spine. There is a fracture involving the right superior pubic ramus and there may be cortical disruption near the right acetabulum.  IMPRESSION: Right pubic ramus fracture.  Nonspecific bowel gas pattern.  Moderate amount of stool.  No acute chest findings.  Original Report Authenticated By: Richarda Overlie, M.D.    Medications: I have reviewed the patient's current medications.  Assessment/Plan: Right Superior Ramus Fracture  Pain control- vicodin  PT/OT Dehydration- IVF  Bun/improved  Dementia: Advance- pleasant confused- continue Aricept  HTn/ tachycardia -  continue Metoprolol  Constipation: colace and senokot  Osteoporosis- Vit D 2000 unit daily  Depression / anxiety- ativan and Effexor; Wellbutrin  Anemia- stable  CKD- stable  Social work- for SNF placement- discuss with son- [facility] as [facility]- closer to home If not [facility] ok with son-  Pt can go tomorrow- to SNF Need FL2.    LOS: 2 days   Jovan Schickling 06/25/2012, 7:45 AM

## 2012-06-25 NOTE — Progress Notes (Signed)
The case discussed with the social worker and the utilization review and Medicare. Patient does not qualify for inpatient stay for going to the skilled facility;Patient had to pay out of pocket. We'll get home health and therapy and nursing it arrangement for home. Follow outpatient in one to 2 weeks

## 2012-06-25 NOTE — Progress Notes (Signed)
Physical Therapy Treatment Patient Details Name: Vickie Hoffman MRN: [id number] DOB: [birth year] Today's Date: 06/25/2012 Time: 5784-6962 PT Time Calculation (min): 16 min  PT Assessment / Plan / Recommendation Comments on Treatment Session  Pt 78 yo female with h/o dementia.  Patient very disoriented and combative, attempted supine to EOB, patient became aggressive and was returned to bed. Pt is unsafe for discharge.  Pt will required 24/7 total assist. Recommend SNF upon discharge.    Follow Up Recommendations  Skilled nursing facility;Supervision/Assistance - 24 hour    Barriers to Discharge        Equipment Recommendations  Defer to next venue    Recommendations for Other Services    Frequency Min 3X/week   Plan      Precautions / Restrictions Precautions Precautions: Fall Restrictions Weight Bearing Restrictions: Yes RLE Weight Bearing: Weight bearing as tolerated LLE Weight Bearing: Weight bearing as tolerated   Pertinent Vitals/Pain None reported    Mobility  Bed Mobility Bed Mobility: Supine to Sit;Sit to Supine;Rolling Left Rolling Left: 3: Mod assist Supine to Sit: 1: +2 Total assist;HOB flat Supine to Sit: Patient Percentage: 50% Sit to Supine: 1: +2 Total assist;HOB flat Sit to Supine: Patient Percentage: 40% Details for Bed Mobility Assistance: VC's for direction and encouragement. Assist for lower extremity. Transfers Transfers: Not assessed Ambulation/Gait Ambulation/Gait Assistance: Not tested (comment)         PT Goals Acute Rehab PT Goals PT Goal Formulation: Patient unable to participate in goal setting Time For Goal Achievement: 07/01/12 Potential to Achieve Goals: Fair Pt will go Supine/Side to Sit: with min assist;with HOB 0 degrees PT Goal: Supine/Side to Sit - Progress: Not progressing Pt will go Sit to Stand: with min assist PT Goal: Sit to Stand - Progress: Not progressing  Visit Information  Last PT Received On:  06/25/12 Assistance Needed: +2    Subjective Data  Subjective: I don't want to eat my breakfast Patient Stated Goal: walking   Cognition  Overall Cognitive Status: History of cognitive impairments - at baseline Arousal/Alertness: Awake/alert Orientation Level: Disoriented to;Place;Time;Situation Behavior During Session: Anxious Cognition - Other Comments: Pt. with h/o advanced dementia       End of Session PT - End of Session Activity Tolerance: Patient limited by pain;Patient limited by fatigue;Treatment limited secondary to agitation Patient left: in bed;with call bell/phone within reach Nurse Communication: Mobility status   GP     Fabio Asa 06/25/2012, 1:08 PM Charlotte Crumb, PT DPT  [phone]

## 2012-06-25 NOTE — Progress Notes (Signed)
Utilization review completed. Janssen Zee, RN, BSN. 

## 2012-06-25 NOTE — Progress Notes (Signed)
Patient discussed with Radene Knee- RNCM and with Dr. Donette Larry regarding inpatient hospital status. Per U.R.- patient will not have a qualifying inpatient hospital stay for SNF placement.  Notified [employer]; messages left for patient's husband and son to call back to discuss.  EMS form completed and placed on chart; nursing notified.  Per MD- he will plan to d/c patient home tomorrow with home health and medical equipment. RNCM is aware.  CSW will continue attempts to notify family, then will sign off.  Discuss above with RNCM- Doristine Counter.   Lorri Frederick. [place]  [phone]

## 2012-06-26 MED ORDER — LORAZEPAM 0.5 MG PO TABS: 0.5000 mg | ORAL_TABLET | Freq: Four times a day (QID) | ORAL | Status: AC | PRN

## 2012-06-26 MED ORDER — HYDROCODONE-ACETAMINOPHEN 5-325 MG PO TABS: 1.0000 | ORAL_TABLET | ORAL | Status: AC | PRN

## 2012-06-26 MED ORDER — SENNA 8.6 MG PO TABS
1.0000 | ORAL_TABLET | Freq: Every day | ORAL | Status: DC
Start: 2012-06-26 — End: 2013-05-07

## 2012-06-26 NOTE — Progress Notes (Signed)
   CARE MANAGEMENT NOTE 06/26/2012  Patient:  Vickie Hoffman, Vickie Hoffman   Account Number:  [id number]  Date Initiated:  06/25/2012  Documentation initiated by:  Vance Peper  Subjective/Objective Assessment:   78 yr old female admitted for pubic rami fracture s/p fall     Action/Plan:   Social Worker was involved with possible SNF placement. Patient will now go home with [facility]. Pt Has dementia. lives with 86 yr old husband. Has family involvement.   Anticipated DC Date:  06/26/2012   Anticipated DC Plan:  HOME W HOME HEALTH SERVICES      DC Planning Services  CM consult      [facility] Choice  HOME HEALTH   Choice offered to / List presented to:  C-4 Adult Children        HH arranged  HH-2 PT  HH-3 OT  HH-4 NURSE'S AIDE  HH-6 SOCIAL WORKER      HH agency  [facility]   Status of service:  Completed, signed off Medicare Important Message given?   (If response is "NO", the following Medicare IM given date fields will be blank) Date Medicare IM given:   Date Additional Medicare IM given:    Discharge Disposition:  HOME W HOME HEALTH SERVICES  Per UR Regulation:    If discussed at Long Length of Stay Meetings, dates discussed:    Comments:  06/26/2012 1220 Call to son, Gala Romney. Reached his wife, Ms Jasso. States pt had [facility] in the past and are requesting their services for [facility]. States she will give info to her husband. Husband and son are on their way to hospital to pick up pt for d/c. NCM made Unit RN, Claris Che aware. Ambulance transport form complete, NCM explained to RN to discuss with husband if needed. Placed info about [facility] on d/c instructions. Contacted [facility] for referral. Entered info in [facility]. Isidoro Donning RN CCM Case Mgmt phone [phone]  06/25/12 1444 Vance Peper, RN BSN Case Manager Patient's husband: Verdon Cummins [phone] Son: Gala Romney [phone]

## 2013-05-07 ENCOUNTER — Emergency Department (HOSPITAL_COMMUNITY): Payer: Medicare Other

## 2013-05-07 ENCOUNTER — Inpatient Hospital Stay (HOSPITAL_COMMUNITY)
Admission: EM | Admit: 2013-05-07 | Discharge: 2013-05-11 | DRG: 065 | Disposition: A | Discharge status: Skilled Nursing Facility | Payer: Medicare Other | Attending: Internal Medicine | Admitting: Internal Medicine

## 2013-05-07 DIAGNOSIS — F411 Generalized anxiety disorder: Secondary | ICD-10-CM | POA: Diagnosis present

## 2013-05-07 DIAGNOSIS — R4701 Aphasia: Secondary | ICD-10-CM | POA: Diagnosis present

## 2013-05-07 DIAGNOSIS — Z79899 Other long term (current) drug therapy: Secondary | ICD-10-CM

## 2013-05-07 DIAGNOSIS — F0390 Unspecified dementia without behavioral disturbance: Secondary | ICD-10-CM | POA: Diagnosis present

## 2013-05-07 DIAGNOSIS — I10 Essential (primary) hypertension: Secondary | ICD-10-CM | POA: Diagnosis present

## 2013-05-07 DIAGNOSIS — E785 Hyperlipidemia, unspecified: Secondary | ICD-10-CM | POA: Diagnosis present

## 2013-05-07 DIAGNOSIS — I639 Cerebral infarction, unspecified: Secondary | ICD-10-CM | POA: Diagnosis present

## 2013-05-07 DIAGNOSIS — R1310 Dysphagia, unspecified: Secondary | ICD-10-CM

## 2013-05-07 DIAGNOSIS — Z7982 Long term (current) use of aspirin: Secondary | ICD-10-CM

## 2013-05-07 DIAGNOSIS — Z888 Allergy status to other drugs, medicaments and biological substances status: Secondary | ICD-10-CM

## 2013-05-07 DIAGNOSIS — F329 Major depressive disorder, single episode, unspecified: Secondary | ICD-10-CM | POA: Diagnosis present

## 2013-05-07 DIAGNOSIS — Z91199 Patient's noncompliance with other medical treatment and regimen due to unspecified reason: Secondary | ICD-10-CM

## 2013-05-07 DIAGNOSIS — Z8249 Family history of ischemic heart disease and other diseases of the circulatory system: Secondary | ICD-10-CM

## 2013-05-07 DIAGNOSIS — E860 Dehydration: Secondary | ICD-10-CM | POA: Diagnosis present

## 2013-05-07 DIAGNOSIS — Z9119 Patient's noncompliance with other medical treatment and regimen: Secondary | ICD-10-CM

## 2013-05-07 DIAGNOSIS — G8190 Hemiplegia, unspecified affecting unspecified side: Secondary | ICD-10-CM | POA: Diagnosis present

## 2013-05-07 DIAGNOSIS — Z882 Allergy status to sulfonamides status: Secondary | ICD-10-CM

## 2013-05-07 DIAGNOSIS — Z823 Family history of stroke: Secondary | ICD-10-CM

## 2013-05-07 DIAGNOSIS — I6350 Cerebral infarction due to unspecified occlusion or stenosis of unspecified cerebral artery: Principal | ICD-10-CM | POA: Diagnosis present

## 2013-05-07 DIAGNOSIS — F0150 Vascular dementia without behavioral disturbance: Secondary | ICD-10-CM | POA: Diagnosis present

## 2013-05-07 DIAGNOSIS — N390 Urinary tract infection, site not specified: Secondary | ICD-10-CM | POA: Diagnosis not present

## 2013-05-07 LAB — CBC
Hemoglobin: 12.4 g/dL (ref 12.0–15.0)
MCH: 29.1 pg (ref 26.0–34.0)
MCHC: 34.1 g/dL (ref 30.0–36.0)
RDW: 14.4 % (ref 11.5–15.5)

## 2013-05-07 LAB — BASIC METABOLIC PANEL
BUN: 33 mg/dL — ABNORMAL HIGH (ref 6–23)
Calcium: 9.6 mg/dL (ref 8.4–10.5)
GFR calc non Af Amer: 46 mL/min — ABNORMAL LOW (ref >90)
Glucose, Bld: 129 mg/dL — ABNORMAL HIGH (ref 70–99)
Sodium: 141 mEq/L (ref 135–145)

## 2013-05-07 LAB — GLUCOSE, CAPILLARY

## 2013-05-07 LAB — PROTIME-INR: INR: 0.89 (ref 0.00–1.49)

## 2013-05-07 MED ORDER — ASPIRIN 300 MG RE SUPP
300.0000 mg | Freq: Once | RECTAL | Status: AC
  Administered 2013-05-07: 300 mg via RECTAL
  Filled 2013-05-07: qty 1

## 2013-05-07 MED ORDER — SODIUM CHLORIDE 0.9 % IV SOLN
INTRAVENOUS | Status: DC
  Administered 2013-05-07 – 2013-05-10 (×2): via INTRAVENOUS

## 2013-05-07 NOTE — ED Notes (Signed)
CBG 112.  

## 2013-05-07 NOTE — ED Notes (Signed)
Per ems-- pt family noticed R facial droop with headache and nv, and slurred speech at 1900. Code stroke acitivated at 2057. EDP exam, stroke team and neurologist arrival at 2115. Pt to CT at 2116. Phlebotomist arrival 2130. Neurologist currently at bedside. Pt is not candidate for TPA due to age and hx of dementia.

## 2013-05-07 NOTE — ED Provider Notes (Signed)
History     CSN: [id number]  Arrival date & time 05/07/13  2114   First MD Initiated Contact with Patient 05/07/13 2133      Chief Complaint  Patient presents with  . Code Stroke    (Consider location/radiation/quality/duration/timing/severity/associated sxs/prior treatment) HPI  Past Medical History  Diagnosis Date  . Dementia   . Anxiety and depression    level V caveat dementia. Seen on arrival. History is obtained from patient's son. Approximate 7 PM tonight patient vomited and then it was noticed that her mouth was drooping. Patient denies pain anywhere.no treatment prior to coming here. Code stroke called in the prehospital setting. Past Surgical History  Procedure Laterality Date  . Compression hip screw  10/03/2011    Procedure: COMPRESSION HIP;  Surgeon: Kathryne Hitch;  Location: [facility] OR;  Service: Orthopedics;  Laterality: Right;  . Fracture surgery      No family history on file.  History  Substance Use Topics  . Smoking status: Never Smoker   . Smokeless tobacco: Not on file  . Alcohol Use: No    OB History   Grav Para Term Preterm Abortions TAB SAB Ect Mult Living                  Review of Systems  Unable to perform ROS: Dementia  Cardiovascular: Positive for leg swelling.       Chronic leg edema and discoloration.  Gastrointestinal: Positive for vomiting.  Neurological: Positive for facial asymmetry.    Allergies  Statins and Sulfa antibiotics  Home Medications   Current Outpatient Rx  Name  Route  Sig  Dispense  Refill  . acetaminophen (TYLENOL) 500 MG tablet   Oral   Take 500 mg by mouth 2 (two) times daily. 1 in the morning and 1 in the evening         . aspirin 81 MG chewable tablet   Oral   Chew 81 mg by mouth daily.           Marland Kitchen buPROPion (WELLBUTRIN SR) 200 MG 12 hr tablet   Oral   Take 200 mg by mouth 2 (two) times daily.           . cholecalciferol (VITAMIN D) 1000 UNITS tablet   Oral   Take 2 tablets (2,000  Units total) by mouth daily.   30 tablet   3   . donepezil (ARICEPT) 10 MG tablet   Oral   Take 10 mg by mouth at bedtime.           Marland Kitchen LORazepam (ATIVAN) 0.5 MG tablet   Oral   Take 1 mg by mouth at bedtime.           . metoprolol tartrate (LOPRESSOR) 12.5 mg TABS   Oral   Take 0.5 tablets (12.5 mg total) by mouth 2 (two) times daily.   60 tablet   0   . multivitamin (THERAGRAN) per tablet   Oral   Take 1 tablet by mouth daily.           Marland Kitchen oxybutynin (DITROPAN) 5 MG tablet   Oral   Take 5 mg by mouth 2 (two) times daily.           Marland Kitchen senna (SENOKOT) 8.6 MG TABS   Oral   Take 1 tablet (8.6 mg total) by mouth at bedtime.   30 each   3   . venlafaxine (EFFEXOR-XR) 75 MG 24 hr capsule   Oral  Take 75 mg by mouth daily.             Pulse 81  Temp(Src) 97.7 F (36.5 C) (Oral)  Resp 22  SpO2 98%  Physical Exam  Nursing note and vitals reviewed. Constitutional: She appears well-developed and well-nourished.  HENT:  Head: Normocephalic and atraumatic.  Right mouth droop  Eyes: Conjunctivae are normal. Pupils are equal, round, and reactive to light.  Neck: Neck supple. No tracheal deviation present. No thyromegaly present.  Cardiovascular: Normal rate and regular rhythm.   No murmur heard. Pulmonary/Chest: Effort normal and breath sounds normal.  Abdominal: Soft. Bowel sounds are normal. She exhibits no distension. There is no tenderness.  Musculoskeletal: Normal range of motion. She exhibits edema. She exhibits no tenderness.  Bilateral 1+ pitting pedal edema  Neurological: She is alert. She has normal reflexes. Coordination normal.  Follow simple commands moves all extremities  Skin: Skin is warm and dry. No rash noted.  Psychiatric: She has a normal mood and affect.    ED Course  Procedures (including critical care time)  Labs Reviewed  CBC  BASIC METABOLIC PANEL  PROTIME-INR  APTT  TROPONIN I   Ct Head Wo Contrast  05/07/2013   *RADIOLOGY  REPORT*  Clinical Data: Facial droop.  Slurred speech.  Weakness.  CT HEAD WITHOUT CONTRAST  Technique:  Contiguous axial images were obtained from the base of the skull through the vertex without contrast.  Comparison: Brain MR dated 08/25/2009.  Findings: Progressive diffuse enlargement of the ventricles and subarachnoid spaces.  Extensive patchy white matter low density in both cerebral hemispheres.  Interval old right occipital lobe infarct.  Interval left basal ganglia and right caudate head lacunar infarcts.  No intracranial hemorrhage, mass lesion or CT evidence of acute infarction.  Unremarkable bones and paranasal sinuses.  IMPRESSION:  1.  No acute abnormality. 2.  Progressive atrophy. 3.  Extensive chronic small vessel white matter ischemic changes. 4.  Interval old left basal ganglia and right caudate head lacunar infarcts and old right occipital lobe infarct.  These results were called by telephone on 05/07/2013 at 2136 hours to Dr. Fonnie Jarvis, who verbally acknowledged these results.   Original Report Authenticated By: Beckie Salts, M.D.     No diagnosis found.   Date: 05/07/2013  Rate: 85  Rhythm: normal sinus rhythm   QRS Axis: left  Intervals: normal  ST/T Wave abnormalities: nonspecific ST/T changes  Conduction Disutrbances:left bundle branch block  Narrative Interpretation:   Old EKG Reviewed: Unchanged from 06/23/2012 as interpreted by me Results for orders placed during the hospital encounter of 05/07/13  CBC      Result Value Range   WBC 9.5  4.0 - 10.5 K/uL   RBC 4.26  3.87 - 5.11 MIL/uL   Hemoglobin 12.4  12.0 - 15.0 g/dL   HCT 16.1  09.6 - 04.5 %   MCV 85.4  78.0 - 100.0 fL   MCH 29.1  26.0 - 34.0 pg   MCHC 34.1  30.0 - 36.0 g/dL   RDW 40.9  81.1 - 91.4 %   Platelets 254  150 - 400 K/uL  BASIC METABOLIC PANEL      Result Value Range   Sodium 141  135 - 145 mEq/L   Potassium 3.9  3.5 - 5.1 mEq/L   Chloride 104  96 - 112 mEq/L   CO2 25  19 - 32 mEq/L   Glucose, Bld  129 (*) 70 - 99 mg/dL   BUN 33 (*)  6 - 23 mg/dL   Creatinine, Ser 1.61  0.50 - 1.10 mg/dL   Calcium 9.6  8.4 - 09.6 mg/dL   GFR calc non Af Amer 46 (*) >90 mL/min   GFR calc Af Amer 54 (*) >90 mL/min  PROTIME-INR      Result Value Range   Prothrombin Time 12.0  11.6 - 15.2 seconds   INR 0.89  0.00 - 1.49  APTT      Result Value Range   aPTT 27  24 - 37 seconds  TROPONIN I      Result Value Range   Troponin I <0.30  <0.30 ng/mL  GLUCOSE, CAPILLARY      Result Value Range   Glucose-Capillary 112 (*) 70 - 99 mg/dL   Comment 1 Notify RN     Comment 2 Documented in Chart     Ct Head Wo Contrast  05/07/2013   *RADIOLOGY REPORT*  Clinical Data: Facial droop.  Slurred speech.  Weakness.  CT HEAD WITHOUT CONTRAST  Technique:  Contiguous axial images were obtained from the base of the skull through the vertex without contrast.  Comparison: Brain MR dated 08/25/2009.  Findings: Progressive diffuse enlargement of the ventricles and subarachnoid spaces.  Extensive patchy white matter low density in both cerebral hemispheres.  Interval old right occipital lobe infarct.  Interval left basal ganglia and right caudate head lacunar infarcts.  No intracranial hemorrhage, mass lesion or CT evidence of acute infarction.  Unremarkable bones and paranasal sinuses.  IMPRESSION:  1.  No acute abnormality. 2.  Progressive atrophy. 3.  Extensive chronic small vessel white matter ischemic changes. 4.  Interval old left basal ganglia and right caudate head lacunar infarcts and old right occipital lobe infarct.  These results were called by telephone on 05/07/2013 at 2136 hours to Dr. Fonnie Jarvis, who verbally acknowledged these results.   Original Report Authenticated By: Beckie Salts, M.D.    Dr.Kirk Luisa Hart evaluated patient in ED. MDM   Patient likely suffered from brainstem stroke. Per Dr. Amada Jupiter suggests aspirin. MRI scan patient is not TPA candidate     Spoke with Dr. Lovell Sheehan plan observation  telemetry Diagnosis stroke  Doug Sou, MD 05/07/13 2326

## 2013-05-08 ENCOUNTER — Observation Stay (HOSPITAL_COMMUNITY): Payer: Medicare Other

## 2013-05-08 ENCOUNTER — Encounter (HOSPITAL_COMMUNITY): Payer: Self-pay | Admitting: Internal Medicine

## 2013-05-08 DIAGNOSIS — I6350 Cerebral infarction due to unspecified occlusion or stenosis of unspecified cerebral artery: Principal | ICD-10-CM

## 2013-05-08 DIAGNOSIS — R1310 Dysphagia, unspecified: Secondary | ICD-10-CM | POA: Diagnosis present

## 2013-05-08 DIAGNOSIS — I10 Essential (primary) hypertension: Secondary | ICD-10-CM

## 2013-05-08 DIAGNOSIS — I639 Cerebral infarction, unspecified: Secondary | ICD-10-CM | POA: Diagnosis present

## 2013-05-08 DIAGNOSIS — F0390 Unspecified dementia without behavioral disturbance: Secondary | ICD-10-CM

## 2013-05-08 LAB — LIPID PANEL
Cholesterol: 234 mg/dL — ABNORMAL HIGH (ref 0–200)
Triglycerides: 88 mg/dL (ref <150)
VLDL: 18 mg/dL (ref 0–40)

## 2013-05-08 LAB — HEMOGLOBIN A1C
Hgb A1c MFr Bld: 5.6 % (ref <5.7)
Mean Plasma Glucose: 114 mg/dL (ref <117)

## 2013-05-08 MED ORDER — LORAZEPAM 0.5 MG PO TABS
0.5000 mg | ORAL_TABLET | Freq: Four times a day (QID) | ORAL | Status: DC | PRN
  Administered 2013-05-10: 0.5 mg via ORAL
  Filled 2013-05-08 (×3): qty 1

## 2013-05-08 MED ORDER — HYDROCHLOROTHIAZIDE 12.5 MG PO TABS: 12.5000 mg | ORAL_TABLET | Freq: Every day | ORAL | Status: DC

## 2013-05-08 MED ORDER — BUPROPION HCL ER (SR) 100 MG PO TB12
200.0000 mg | ORAL_TABLET | Freq: Two times a day (BID) | ORAL | Status: DC
  Administered 2013-05-09 – 2013-05-11 (×5): 200 mg via ORAL
  Filled 2013-05-08 (×8): qty 2

## 2013-05-08 MED ORDER — SODIUM CHLORIDE 0.9 % IV SOLN: INTRAVENOUS | Status: AC

## 2013-05-08 MED ORDER — ENOXAPARIN SODIUM 40 MG/0.4ML ~~LOC~~ SOLN
40.0000 mg | SUBCUTANEOUS | Status: DC
  Administered 2013-05-09 – 2013-05-11 (×3): 40 mg via SUBCUTANEOUS
  Filled 2013-05-08 (×4): qty 0.4

## 2013-05-08 MED ORDER — DONEPEZIL HCL 10 MG PO TABS
10.0000 mg | ORAL_TABLET | Freq: Every day | ORAL | Status: DC
  Administered 2013-05-09 – 2013-05-10 (×2): 10 mg via ORAL
  Filled 2013-05-08 (×4): qty 1

## 2013-05-08 MED ORDER — ASPIRIN 300 MG RE SUPP
300.0000 mg | Freq: Every day | RECTAL | Status: DC
  Filled 2013-05-08 (×4): qty 1

## 2013-05-08 MED ORDER — VENLAFAXINE HCL ER 75 MG PO CP24
75.0000 mg | ORAL_CAPSULE | Freq: Every day | ORAL | Status: DC
  Administered 2013-05-09 – 2013-05-11 (×3): 75 mg via ORAL
  Filled 2013-05-08 (×4): qty 1

## 2013-05-08 MED ORDER — ASPIRIN 325 MG PO TABS
325.0000 mg | ORAL_TABLET | Freq: Every day | ORAL | Status: DC
  Administered 2013-05-09 – 2013-05-11 (×3): 325 mg via ORAL
  Filled 2013-05-08 (×5): qty 1

## 2013-05-08 MED ORDER — ASPIRIN 325 MG PO TABS: 325.0000 mg | ORAL_TABLET | Freq: Every day | ORAL | Status: DC

## 2013-05-08 NOTE — Consult Note (Signed)
Neurology Consultation Reason for Consult: Right facial droop Referring Physician: Alden Server  CC: n/v  History is obtained from:Patient, son  HPI: Vickie Hoffman is a 79 y.o. female who was having dinner earlier when she had vomiting. There was discussion of headache on EMS arrival, but the son was not aware of this. Following her vomiting, she was noted to have slurred speech and a right facial droop.   She has a history of significant dementia  LKW: 7pm tpa given: no, mild symptoms  ROS: A 14 point ROS was performed and is negative except as noted in the HPI.  Past Medical History  Diagnosis Date  . Dementia   . Anxiety and depression     Family History: Mother - CVA  Social History: Tob: never smoker  Exam: Current vital signs: BP 174/72  Pulse 93  Temp(Src) 97.7 F (36.5 C) (Oral)  Resp 22  SpO2 97% Vital signs in last 24 hours: Temp:  [97.7 F (36.5 C)] 97.7 F (36.5 C) (06/14 2214) Pulse Rate:  [81-93] 93 (06/14 2345) Resp:  [11-22] 22 (06/14 2345) BP: (107-182)/(59-79) 174/72 mmHg (06/14 2345) SpO2:  [97 %-99 %] 97 % (06/14 2345)  General: in bed, NAD CV: RRR Mental Status: Patient is awake, alert, oriented to person, age, but not month. She is not oriented to place.  She has little interactive ness during the exam, her responses are short, but appropriate. She is able to name simple objects. No signs of neglect.  Cranial Nerves: II: Visual Fields are full. Pupils are reactive to light, right is irregular.  Discs are difficult to visualize. III,IV, VI: EOMI without ptosis or diploplia.  V: Facial sensation is symmetric to temperature VII: Facial movement is notable for a right lower facial paresis.  VIII: hearing is intact to voice X: Uvula elevates symmetrically XI: Shoulder shrug is symmetric. XII: tongue is midline without atrophy or fasciculations.  Motor: Tone is normal. Bulk is normal. Does not comply fully with testing, but strength  appears symmetric.  Sensory: Sensation is symmetric to light touch and pin in the arms and legs. Deep Tendon Reflexes: 2+ and symmetric in the biceps and patellae.  Cerebellar: Does not comply with testing, but no clear ataxia Gait: Not assessed due to acute nature of evaluation and multiple medical monitors in ED setting.  I have reviewed labs in epic and the results pertinent to this consultation are: BMP - unremarkable  CBC - unremarkable  I have reviewed the images obtained:CT head - atrophy  Impression: 79 yo F with right facial droop and ? Mild expressive aphasia that was sudden onset. I suspect a small stroke.   Recommendations: 1. HgbA1c, fasting lipid panel 2. MRI, MRA  of the brain without contrast 3. Frequent neuro checks 4. Echocardiogram 5. Carotid dopplers if felt to be candidate for surgery  6. Prophylactic therapy-Antiplatelet med: Aspirin - dose 325mg .  7. Risk factor modification 8. Telemetry monitoring 9. PT consult, OT consult, Speech consult    Ritta Slot, MD [facility] [phone]  If 7pm- 7am, please page neurology on call at [phone].

## 2013-05-08 NOTE — Progress Notes (Signed)
Subjective: Pt did ok at bedside swallow- with cracker and sip water Some right facial droop Other wise at baseline MS husband mention ext edema for some time  Objective: Vital signs in last 24 hours: Temp:  [97.3 F (36.3 C)-97.9 F (36.6 C)] 97.9 F (36.6 C) (06/15 0800) Pulse Rate:  [81-93] 85 (06/15 0800) Resp:  [11-22] 18 (06/15 0800) BP: (107-182)/(59-79) 181/74 mmHg (06/15 0800) SpO2:  [97 %-100 %] 99 % (06/15 0800) Weight:  [68.8 kg (151 lb 10.8 oz)] 68.8 kg (151 lb 10.8 oz) (06/15 0036) Weight change:  Last BM Date: 05/05/13  Intake/Output from previous day:   Intake/Output this shift:    General appearance: alert Resp: clear to auscultation bilaterally Cardio: regular rate and rhythm Neurologic: Grossly normal  Lab Results:  Recent Labs  05/07/13 2116  WBC 9.5  HGB 12.4  HCT 36.4  PLT 254   BMET  Recent Labs  05/07/13 2116  NA 141  K 3.9  CL 104  CO2 25  GLUCOSE 129*  BUN 33*  CREATININE 1.03  CALCIUM 9.6    Studies/Results: Dg Chest 2 View  05/08/2013   *RADIOLOGY REPORT*  Clinical Data: Stroke, possible aspiration  CHEST - 2 VIEW  Comparison: 06/23/2012  Findings: Moderate hiatal hernia.  Mild cardiomegaly.  Atheromatous aorta.  Coarse perihilar interstitial markings.  No confluent airspace consolidation.  No definite effusion.  Spurring in the lower thoracic spine.  Advanced degenerative change of the left shoulder.  IMPRESSION:  1.  Stable mild cardiomegaly and moderate hiatal hernia.  No definite acute disease.   Original Report Authenticated By: D. Andria Rhein, MD   Ct Head Wo Contrast  05/07/2013   *RADIOLOGY REPORT*  Clinical Data: Facial droop.  Slurred speech.  Weakness.  CT HEAD WITHOUT CONTRAST  Technique:  Contiguous axial images were obtained from the base of the skull through the vertex without contrast.  Comparison: Brain MR dated 08/25/2009.  Findings: Progressive diffuse enlargement of the ventricles and subarachnoid spaces.   Extensive patchy white matter low density in both cerebral hemispheres.  Interval old right occipital lobe infarct.  Interval left basal ganglia and right caudate head lacunar infarcts.  No intracranial hemorrhage, mass lesion or CT evidence of acute infarction.  Unremarkable bones and paranasal sinuses.  IMPRESSION:  1.  No acute abnormality. 2.  Progressive atrophy. 3.  Extensive chronic small vessel white matter ischemic changes. 4.  Interval old left basal ganglia and right caudate head lacunar infarcts and old right occipital lobe infarct.  These results were called by telephone on 05/07/2013 at 2136 hours to Dr. Fonnie Jarvis, who verbally acknowledged these results.   Original Report Authenticated By: Beckie Salts, M.D.    Medications: I have reviewed the patient's current medications.  Assessment/Plan: Expressive Aphasia/ with right facial droop- probable CVA- prior CVA on CT- Check MRI Given her age and NSR- d/c Carotid and Echo ASA 325 mg daily BP mild high- watch- may need diuretic if BP still high-   Some edema legs Dementia moderate with Depression- restart meds- MS at patients baseline Speech consult   LOS: 1 day   Kyisha Fowle 05/08/2013, 9:40 AM

## 2013-05-08 NOTE — Progress Notes (Signed)
[facility] hospital[facility] progress note. Chief complaint. Decreased level of consciousness. History of present illness. This 79 year old female admitted earlier with a facial droop. CT scan of the head was performed and was negative for acute findings. She was not deemed to be a TPA candidate her neurology consult. Nursing page tach and notified that the patient had become less alert and appeared not to be verbalizing all. I reviewed the admission history and neurology consult note which indicated the patient had a right facial droop, and mild expressive aphasia with sudden onset. A small stroke was suspected. I came to see the patient at bedside and Doctor Amada Jupiter of neurology was also notified. I found the patient somnolent but easily arousable. He had no specific complaints. She does not appear to be oriented to place or time. Physical exam. Vital signs. Temperature 97.3, pulse 83, respiration 16, blood pressure 167/67. O2 sats 97%. General appearance. Well-developed elderly female who was initially somnolent but is easily awoken. She is alert and verbal. Cardiac. Rate and rhythm regular. Lungs. Breath sounds clear and equal. Abdomen. Soft positive bowel sounds. No pain. Neurologic. The patient with a right facial droop. Her speech is somewhat slurred but understandable. She is only oriented to self currently. Grip strength and arm and leg strength somewhat weaker on the left and right. She moves all 4 extremities independently. She is able to define gravity with all 4 extremities. Impression/plan Problem #1 possible new CVA. All other than some decrease in orientation I find the patient essentially at baseline per that described in the admission H&P and neurology consult. She is alert, pleasant and cooperative. Her speech is slurred but understandable and she follows instructions appropriately. Doctor Amada Jupiter was updated with these findings and previously ordered stat CT scan has been canceled.

## 2013-05-08 NOTE — H&P (Signed)
[facility] History and Physical  Vickie Hoffman WUX:[id number] DOB: 07/10/1920 DOA: 05/07/2013  Referring physician: EDP PCP: Georgann Housekeeper, MD  Specialists:   Chief Complaint:   HPI: CLOTEAL Hoffman is a 79 y.o. female who was brought to the ED after her husband observed her to have vomiting after eating at dinner and her mouth was drawn on the right side.   He was concerned she may be having a stroke so she was brought to the ED and evaluated as a Code Stroke.  The symptom occurred at 7 pm.   A CT scan of the head was performed and found to be negative for acute findings, however she failed the bedside swallowing evaluation.   She was not deemed to be a TPA candidate by Neurology and was refered for admission for further evaluation.      Review of Systems: The patient denies anorexia, fever, chills, headaches, weight loss,, vision loss, diplopia, dizziness, decreased hearing, rhinitis, hoarseness, chest pain, syncope, dyspnea on exertion, peripheral edema, balance deficits, cough, hemoptysis, abdominal pain, nausea, vomiting, diarrhea, constipation, hematemesis, melena, hematochezia, severe indigestion/heartburn, dysuria, hematuria, incontinence, muscle weakness, suspicious skin lesions, transient blindness, difficulty walking, depression, unusual weight change, abnormal bleeding, enlarged lymph nodes, angioedema, and breast masses.   Past Medical History  Diagnosis Date  . Dementia   . Anxiety and depression     Past Surgical History  Procedure Laterality Date  . Compression hip screw  10/03/2011    Procedure: COMPRESSION HIP;  Surgeon: Kathryne Hitch;  Location: [facility] OR;  Service: Orthopedics;  Laterality: Right;  . Fracture surgery      Prior to Admission medications   Medication Sig Start Date End Date Taking? Authorizing Provider  acetaminophen (TYLENOL) 500 MG tablet Take 500 mg by mouth 2 (two) times daily. 1 in the morning and 1 in the evening   Yes Historical  Provider, MD  aspirin 81 MG chewable tablet Chew 81 mg by mouth daily.     Yes Historical Provider, MD  buPROPion (WELLBUTRIN SR) 200 MG 12 hr tablet Take 200 mg by mouth 2 (two) times daily.    Yes Historical Provider, MD  cholecalciferol (VITAMIN D) 1000 UNITS tablet Take 2 tablets (2,000 Units total) by mouth daily. 06/25/12 06/25/13 Yes Georgann Housekeeper, MD  donepezil (ARICEPT) 10 MG tablet Take 10 mg by mouth at bedtime.     Yes Historical Provider, MD  LORazepam (ATIVAN) 0.5 MG tablet Take 0.5 mg by mouth at bedtime.    Yes Historical Provider, MD  multivitamin [facility]) per tablet Take 1 tablet by mouth daily.     Yes Historical Provider, MD  oxybutynin (DITROPAN) 5 MG tablet Take 5 mg by mouth at bedtime.    Yes Historical Provider, MD  venlafaxine (EFFEXOR-XR) 75 MG 24 hr capsule Take 75 mg by mouth daily.     Yes Historical Provider, MD    Allergies  Allergen Reactions  . Statins Other (See Comments)    Muscle cramps, pains, leg weakness  . Sulfa Antibiotics Other (See Comments)    Unknown     Social History:  reports that she has never smoked. She does not have any smokeless tobacco history on file. She reports that she does not drink alcohol or use illicit drugs.     Family History  Problem Relation Age of Onset  . CVA Mother   . CAD Mother     (be sure to complete)   Physical Exam:  GEN:  Pleasant  79 y.o. Elderly well developed Caucasian female  examined  and in no acute distress; cooperative with exam Filed Vitals:   05/07/13 2300 05/07/13 2315 05/07/13 2330 05/07/13 2345  BP: 174/64 178/72 176/78 174/72  Pulse: 89 92 91 93  Temp:      TempSrc:      Resp: 11 20 21 22   SpO2: 98% 97% 97% 97%   Blood pressure 174/72, pulse 93, temperature 97.7 F (36.5 C), temperature source Oral, resp. rate 22, SpO2 97.00%. PSYCH: She is alert and oriented x 1; does not appear anxious does not appear depressed; affect is normal HEENT: Normocephalic and Atraumatic, Mucous membranes  pink; PERRLA; EOM intact; Fundi:  Benign;  No scleral icterus, Nares: Patent, Oropharynx: Clear, Fair Dentition, Neck:  FROM, no cervical lymphadenopathy nor thyromegaly or carotid bruit; no JVD; Breasts:: Not examined CHEST WALL: No tenderness CHEST: Normal respiration, clear to auscultation bilaterally HEART: Regular rate and rhythm; no murmurs rubs or gallops BACK: No kyphosis or scoliosis; no CVA tenderness ABDOMEN: Positive Bowel Sounds,  soft non-tender; no masses, no organomegaly.   Rectal Exam: Not done EXTREMITIES: No cyanosis, clubbing or edema; no ulcerations. Genitalia: not examined PULSES: 2+ and symmetric SKIN: Normal hydration no rash or ulceration CNS: Cranial nerves 2-12 grossly intact no focal neurologic deficit    Labs on Admission:  Basic Metabolic Panel:  Recent Labs Lab 05/07/13 2116  NA 141  K 3.9  CL 104  CO2 25  GLUCOSE 129*  BUN 33*  CREATININE 1.03  CALCIUM 9.6   Liver Function Tests: No results found for this basename: AST, ALT, ALKPHOS, BILITOT, PROT, ALBUMIN,  in the last 168 hours No results found for this basename: LIPASE, AMYLASE,  in the last 168 hours No results found for this basename: AMMONIA,  in the last 168 hours CBC:  Recent Labs Lab 05/07/13 2116  WBC 9.5  HGB 12.4  HCT 36.4  MCV 85.4  PLT 254   Cardiac Enzymes:  Recent Labs Lab 05/07/13 2116  TROPONINI <0.30    BNP (last 3 results) No results found for this basename: PROBNP,  in the last 8760 hours CBG:  Recent Labs Lab 05/07/13 2223  GLUCAP 112*    Radiological Exams on Admission: Ct Head Wo Contrast  05/07/2013   *RADIOLOGY REPORT*  Clinical Data: Facial droop.  Slurred speech.  Weakness.  CT HEAD WITHOUT CONTRAST  Technique:  Contiguous axial images were obtained from the base of the skull through the vertex without contrast.  Comparison: Brain MR dated 08/25/2009.  Findings: Progressive diffuse enlargement of the ventricles and subarachnoid spaces.   Extensive patchy white matter low density in both cerebral hemispheres.  Interval old right occipital lobe infarct.  Interval left basal ganglia and right caudate head lacunar infarcts.  No intracranial hemorrhage, mass lesion or CT evidence of acute infarction.  Unremarkable bones and paranasal sinuses.  IMPRESSION:  1.  No acute abnormality. 2.  Progressive atrophy. 3.  Extensive chronic small vessel white matter ischemic changes. 4.  Interval old left basal ganglia and right caudate head lacunar infarcts and old right occipital lobe infarct.  These results were called by telephone on 05/07/2013 at 2136 hours to Dr. Fonnie Jarvis, who verbally acknowledged these results.   Original Report Authenticated By: Beckie Salts, M.D.     Assessment/Plan Principal Problem:   CVA (cerebral infarction) Active Problems:   Dysphagia   HTN (hypertension)   Dementia   1.   CVA versus TIA- CVA workup initiated, Neuro  checks, and NPO until Speech evaluation.  MRI/MRA brain in AM.    2.   HTN-  Momitor BPs, PRN IV Hydralazine.    3.   Dysphagia- NPO, Speech Therapy swallowing Evaluation  4.   Dementia- on Aricept, held since NPO.     5.  DVT prophylaxis with Lovenox.        Code Status:      FULL CODE Family Communication:    Family at Bedside Disposition Plan:      Return to Home on discharge  Time spent: 70 Minutes  Ron Parker [facility] Pager [phone]  If 7PM-7AM, please contact night-coverage [email or web address] Password [email or web address] 05/08/2013, 12:20 AM

## 2013-05-08 NOTE — Progress Notes (Signed)
Pt yelled and screamed to get out through entire scan. Would speak with pt in b/t each series of pics and coaxed pt through as much imaging as she would tolerate. MRA not ran ; all images acquired were 30 secs-1 mins or had motion correction technology applied. This is not an option for MRA; at this point a MRA will not be diagnostic unless pt is able to be more cooperative. MRI brain w/o cm is done however.

## 2013-05-08 NOTE — Progress Notes (Signed)
Stroke Team Progress Note  HISTORY Vickie Hoffman is a 79 y.o. female who was having dinner on 05/07/2013 when she had vomiting. There was discussion of headache on EMS arrival, but the son was not aware of this. Following her vomiting, she was noted to have slurred speech and a right facial droop. She has a history of significant dementia   LKW: 7pm  tpa given: no, mild symptoms   SUBJECTIVE There are no family members present this morning. The patient is mildly confused. She just woke up. She has no complaints.  OBJECTIVE Most recent Vital Signs: Filed Vitals:   05/08/13 0036 05/08/13 0303 05/08/13 0531 05/08/13 0800  BP: 178/69 154/69 167/67 181/74  Pulse: 92 91 83 85  Temp: 97.4 F (36.3 C) 97.4 F (36.3 C) 97.3 F (36.3 C) 97.9 F (36.6 C)  TempSrc: Oral Axillary Axillary Oral  Resp: 16  16 18   Height: 5\' 4"  (1.626 m)     Weight: 68.8 kg (151 lb 10.8 oz)     SpO2: 100% 97%  99%   CBG (last 3)   Recent Labs  05/07/13 2223  GLUCAP 112*    IV Fluid Intake:   . sodium chloride 100 mL/hr at 05/07/13 2149    MEDICATIONS  . sodium chloride   Intravenous STAT  . aspirin  300 mg Rectal Daily   Or  . aspirin  325 mg Oral Daily  . buPROPion  200 mg Oral BID  . donepezil  10 mg Oral QHS  . enoxaparin (LOVENOX) injection  40 mg Subcutaneous Q24H  . [START ON 05/09/2013] venlafaxine XR  75 mg Oral Q breakfast   PRN:  LORazepam  Diet:  Dysphagia thin liquids Activity:  As tolerated DVT Prophylaxis:  Lovenox  CLINICALLY SIGNIFICANT STUDIES Basic Metabolic Panel:  Recent Labs Lab 05/07/13 2116  NA 141  K 3.9  CL 104  CO2 25  GLUCOSE 129*  BUN 33*  CREATININE 1.03  CALCIUM 9.6   Liver Function Tests: No results found for this basename: AST, ALT, ALKPHOS, BILITOT, PROT, ALBUMIN,  in the last 168 hours CBC:  Recent Labs Lab 05/07/13 2116  WBC 9.5  HGB 12.4  HCT 36.4  MCV 85.4  PLT 254   Coagulation:  Recent Labs Lab 05/07/13 2116  LABPROT 12.0   INR 0.89   Cardiac Enzymes:  Recent Labs Lab 05/07/13 2116  TROPONINI <0.30   Urinalysis: No results found for this basename: COLORURINE, APPERANCEUR, LABSPEC, PHURINE, GLUCOSEU, HGBUR, BILIRUBINUR, KETONESUR, PROTEINUR, UROBILINOGEN, NITRITE, LEUKOCYTESUR,  in the last 168 hours Lipid Panel    Component Value Date/Time   CHOL 234* 05/08/2013 0535   TRIG 88 05/08/2013 0535   HDL 72 05/08/2013 0535   CHOLHDL 3.3 05/08/2013 0535   VLDL 18 05/08/2013 0535   LDLCALC 144* 05/08/2013 0535   HgbA1C  No results found for this basename: HGBA1C    Urine Drug Screen:   No results found for this basename: labopia, cocainscrnur, labbenz, amphetmu, thcu, labbarb    Alcohol Level: No results found for this basename: ETH,  in the last 168 hours  Dg Chest 2 View  05/08/2013 1.  Stable mild cardiomegaly and moderate hiatal hernia.  No definite acute disease.      Ct Head Wo Contrast  05/07/2013  1.  No acute abnormality. 2.  Progressive atrophy. 3.  Extensive chronic small vessel white matter ischemic changes. 4.  Interval old left basal ganglia and right caudate head lacunar infarcts and old  right occipital lobe infarct.  MRI of the brain   IMPRESSION:  Tiny acute infarct posterior right pontomedullary junction.   MRA of the brain   2D Echocardiogram  canceled  Carotid Doppler  canceled  EKG  sinus rhythm rate 86 beats per minute.  Therapy Recommendations evaluations pending  Physical Exam    General - the pleasant mildly confused 45-year-old female in no acute distress. Heart - Regular rate and rhythm - no murmer Lungs - Clear to auscultation Abdomen - Soft - non tender Extremities - Distal pulses intact - no edema Skin - Warm and dry  NEUROLOGIC:   MENTAL STATUS: awake, mildly lethargic, some word finding difficulties,  follows simple commands.  CRANIAL NERVES: pupils equal and reactive to light,extraocular muscles intact, facial sensation intact, right facial droop, uvula  midlinec, tongue midline MOTOR: normal bulk and tone, Strength -4/5 throughout SENSORY: normal and symmetric to light touch  COORDINATION: finger-nose-finger slow but intact     ASSESSMENT [name] is a 79 y.o. female presenting with vomiting, dysphagia, dysarthria, and right facial droop.Marland Kitchen TPA was not given secondary to mild symptoms. A CT scan confirmed no acute abnormality; however, old left basal ganglia and right caudate head lacunar infarcts with old right occipital lobe infarct. MRI/MRA is pending. On aspirin 81 mg orally every day prior to admission. Now on aspirin 325 mg orally every day for secondary stroke prevention. Patient with resultant right facial droop and possibly mild aphasia. Work up underway.   Dementia  Anxiety and depression  Multiple old CVAs.  Hyperlipidemia cholesterol 234 and LDL 144. The patient has a history of statin intolerance.  Probable dehydration with elevated BUN.  Hypertension  Hospital day # 1  TREATMENT/PLAN  Continue aspirin 325 mg orally every day for secondary stroke prevention.  Await therapists evaluations.  Carotid Doppler studies, 2-D echocardiogram canceled secondary to age and dementia  Hassel Neth [facility] Pager [phone] 05/08/2013, 9:52 AM   I have personally obtained a history, examined the patient, evaluated imaging results, and formulated the assessment and plan of care. I agree with the above.

## 2013-05-08 NOTE — Evaluation (Addendum)
Clinical/Bedside Swallow Evaluation Patient Details  Name: BRITTNY SPANGLE MRN: [id number] Date of Birth: June 12, 1920  Today's Date: 05/08/2013 Time: 1230-1300 SLP Time Calculation (min): 30 min  Past Medical History:  Past Medical History  Diagnosis Date  . Dementia   . Anxiety and depression    Past Surgical History:  Past Surgical History  Procedure Laterality Date  . Compression hip screw  10/03/2011    Procedure: COMPRESSION HIP;  Surgeon: Kathryne Hitch;  Location: [facility] OR;  Service: Orthopedics;  Laterality: Right;  . Fracture surgery     HPI:  Vickie Hoffman is a 79 y.o. female who was having dinner on 05/07/2013 when she had vomiting. There was discussion of headache on EMS arrival, but the son was not aware of this. Following her vomiting, she was noted to have slurred speech and a right facial droop. She has a history of significant dementia  1. No acute abnormality. 2. Progressive atrophy. 3. Extensive chronic small vessel white matter ischemic changes. 4. Interval old left basal ganglia and right caudate head lacunar infarcts and old right occipital lobe infarct. MRI: Tiny acute infarct posterior right pontomedullary junction.  BSE ordered per Stroke Protocol.      Assessment / Plan / Recommendation Clinical Impression  Evaluation limited to observation of thin liquids only due to lack of cooperation from patient.  SLP attempted x4 to complete evaluation as patient presenting with confusion and declined initial attempts of PO's.  Last attempt was during noon meal.  Patient readily accepted thin liquid by straw with immediate coughing s/p swallow.  No outward s/s of aspiration noted with sip by cup but patient declined further trials.  Patient stated she was diabetic but unsure how reliable this is.    Continue current diet of dysphagia 1 (puree) and thin liquids by cup sips only with full supervision with all meals.  ST to f/u on 05/09/13 to assess patient with  regular solids for possible diet upgrade and to determine need for thickened liquids.      Aspiration Risk  Moderate    Diet Recommendation Thin liquid;Dysphagia 1 (Puree)   Liquid Administration via: No straw;Cup Medication Administration: Crushed with puree Supervision: Full supervision/cueing for compensatory strategies Postural Changes and/or Swallow Maneuvers: Seated upright 90 degrees;Upright 30-60 min after meal    Other  Recommendations Oral Care Recommendations: Oral care QID   Follow Up Recommendations  Skilled Nursing facility    Frequency and Duration min 2x/week  2 weeks       SLP Swallow Goals Patient will utilize recommended strategies during swallow to increase swallowing safety with: Total assistance Goal #3: Consume diagnostic PO trials of dysphagia 3 consistency and regular solids with no outward s/s of aspiration  to assess readiness for diet advancement   Swallow Study Prior Functional Status   Lived at home     General Date of Onset: 05/07/13 HPI: Vickie Hoffman is a 79 y.o. female who was having dinner on 05/07/2013 when she had vomiting. There was discussion of headache on EMS arrival, but the son was not aware of this. Following her vomiting, she was noted to have slurred speech and a right facial droop. She has a history of significant dementia  Type of Study: Bedside swallow evaluation Diet Prior to this Study: Dysphagia 1 (puree);Thin liquids Temperature Spikes Noted: No Respiratory Status: Room air Behavior/Cognition: Confused;Uncooperative;Doesn't follow directions;Decreased sustained attention Oral Cavity - Dentition: Adequate natural dentition Self-Feeding Abilities: Able to feed self;Needs assist Patient Positioning:  Upright in bed Baseline Vocal Quality: Clear Volitional Cough: Cognitively unable to elicit Volitional Swallow: Unable to elicit    Oral/Motor/Sensory Function Overall Oral Motor/Sensory Function: Impaired Labial ROM:  Reduced right Labial Symmetry: Abnormal symmetry right Labial Strength: Reduced Labial Sensation: Reduced Lingual ROM: Reduced right Lingual Symmetry: Abnormal symmetry right Lingual Strength:  (unable to assess due to patient declining to open her mouth) Facial ROM: Reduced right Facial Symmetry: Right droop Facial Strength: Reduced Facial Sensation: Reduced Velum:  (unable to assess ) Mandible: Within Functional Limits   Ice Chips Ice chips: Not tested   Thin Liquid Thin Liquid: Impaired Presentation: Cup;Straw Pharyngeal  Phase Impairments: Suspected delayed Swallow;Decreased hyoid-laryngeal movement;Cough - Immediate Other Comments: cough s/p swallow of thin liquid by straw    Nectar Thick Nectar Thick Liquid: Not tested Other Comments: Patient declined   Honey Thick Honey Thick Liquid: Not tested   Puree Puree: Not tested   Solid   GO    Solid: Not tested      Moreen Fowler MS, CCC-SLP [phone] [facility] 05/08/2013,2:52 PM

## 2013-05-09 LAB — URINE MICROSCOPIC-ADD ON

## 2013-05-09 LAB — URINALYSIS, ROUTINE W REFLEX MICROSCOPIC
Glucose, UA: NEGATIVE mg/dL
Nitrite: POSITIVE — AB
Specific Gravity, Urine: 1.013 (ref 1.005–1.030)
pH: 8.5 — ABNORMAL HIGH (ref 5.0–8.0)

## 2013-05-09 MED ORDER — BISOPROLOL-HYDROCHLOROTHIAZIDE 2.5-6.25 MG PO TABS
1.0000 | ORAL_TABLET | Freq: Every day | ORAL | Status: DC
  Administered 2013-05-09 – 2013-05-11 (×3): 1 via ORAL
  Filled 2013-05-09 (×4): qty 1

## 2013-05-09 MED ORDER — CIPROFLOXACIN HCL 250 MG PO TABS
250.0000 mg | ORAL_TABLET | Freq: Two times a day (BID) | ORAL | Status: DC
  Administered 2013-05-09 – 2013-05-11 (×5): 250 mg via ORAL
  Filled 2013-05-09 (×8): qty 1

## 2013-05-09 MED ORDER — CIPROFLOXACIN HCL 250 MG PO TABS: 250.0000 mg | ORAL_TABLET | Freq: Two times a day (BID) | ORAL | Status: DC

## 2013-05-09 MED ORDER — BISOPROLOL-HYDROCHLOROTHIAZIDE 2.5-6.25 MG PO TABS: 1.0000 | ORAL_TABLET | Freq: Every day | ORAL | Status: DC

## 2013-05-09 NOTE — Progress Notes (Signed)
Clinical Social Work Department BRIEF PSYCHOSOCIAL ASSESSMENT 05/09/2013  Patient:  Vickie Hoffman, Vickie Hoffman     Account Number:  [id number]     Admit date:  05/07/2013  Clinical Social Worker:  Illene Silver  Date/Time:  05/09/2013 12:00 M  Referred by:  Physician  Date Referred:  05/09/2013 Referred for  SNF Placement   Other Referral:   Interview type:  Family Other interview type:   Database review.    PSYCHOSOCIAL DATA Living Status:  HUSBAND Admitted from facility:   Level of care:   Primary support name:  mr. Stice Primary support relationship to patient:  SPOUSE Degree of support available:   Good.  Pt's husband wants pt to get stronger so she can return home after SNF stay.    CURRENT CONCERNS Current Concerns  Post-Acute Placement   Other Concerns:    SOCIAL WORK ASSESSMENT / PLAN CSW spoke with pt's husband by telephone to discuss SNF placement.  Pt's husband familiar with placement process as pt has been at [facility] in the past, following hip surgery.  Pt's husband wpould like for pt to return to [facility] if a bed is available.   Assessment/plan status:  Psychosocial Support/Ongoing Assessment of Needs Other assessment/ plan:   Information/referral to community resources:    PATIENT'S/FAMILY'S RESPONSE TO PLAN OF CARE: Pt's husband very pleasant.  Anxious for pt to begin rehab so she can return home.

## 2013-05-09 NOTE — Progress Notes (Signed)
Lab work noted with LDL of 144 ratio 3.3 ( ratio is under 4) Given her age; hold off on starting a statin. Vickie Hoffman

## 2013-05-09 NOTE — Progress Notes (Addendum)
Clinical Social Work Department CLINICAL SOCIAL WORK PLACEMENT NOTE 05/09/2013  Patient:  Vickie Hoffman, Vickie Hoffman  Account Number:  [id number] Admit date:  05/07/2013  Clinical Social Worker:  Pollyann Savoy, LCSW  Date/time:  05/09/2013 10:26 PM  Clinical Social Work is seeking post-discharge placement for this patient at the following level of care:   SKILLED NURSING   (*CSW will update this form in Epic as items are completed)   05/09/2013  Patient/family provided with [facility] Department of Clinical Social Work's list of facilities offering this level of care within the geographic area requested by the patient (or if unable, by the patient's family).  05/09/2013  Patient/family informed of their freedom to choose among providers that offer the needed level of care, that participate in Medicare, Medicaid or managed care program needed by the patient, have an available bed and are willing to accept the patient.  05/09/2013  Patient/family informed of [facility]' ownership interest in [facility], as well as of the fact that they are under no obligation to receive care at this facility.  PASARR submitted to EDS on  Pre-Existing PASARR number received from EDS on   FL2 transmitted to all facilities in geographic area requested by pt/family on  05/09/2013 FL2 transmitted to all facilities within larger geographic area on   Patient informed that his/her managed care company has contracts with or will negotiate with  certain facilities, including the following:     Patient/family informed of bed offers received:  05/10/2013 Patient chooses bed at  [facility] Physician recommends and patient chooses bed at    Patient to be transferred to [facility] on 05/11/2013   Patient to be transferred to facility by  [facility]  The following physician request were entered in Epic:   Additional Comments:

## 2013-05-09 NOTE — Progress Notes (Signed)
Speech Language Pathology Dysphagia Treatment Patient Details Name: Vickie Hoffman MRN: [id number] DOB: 29-May-1920 Today's Date: 05/09/2013 Time: 8295-6213 SLP Time Calculation (min): 8 min  Assessment / Plan / Recommendation Clinical Impression  SLP attempted further diagnostic treatment of swallow given poor participation with therapist yesterday. Pt also needed cognitive linguistic eval given concern for aphasia in setting of dementia. SLP was able to awake pt from sleep, give few sips of water before pt taken for test. (MRI? pt already had one and MD note states MRI not needed). Pt still lethargic. Sips of water resulted in anterior spillage on right. No evidence of aspiration though swallow response did appear subjectively delayed. WIll f/u tomorrow to reassess. Continue current diet for now, RN repors pt tolerating at meals times.     Diet Recommendation  Continue with Current Diet: Dysphagia 1 (puree);Thin liquid    SLP Plan Continue with current plan of care   Pertinent Vitals/Pain NA   Swallowing Goals  SLP Swallowing Goals Patient will utilize recommended strategies during swallow to increase swallowing safety with: Total assistance Swallow Study Goal #2 - Progress: Progressing toward goal  General    Oral Cavity - Oral Hygiene     Dysphagia Treatment Treatment focused on: Skilled observation of diet tolerance Patient observed directly with PO's: Yes Type of PO's observed: Thin liquids Feeding: Needs assist Liquids provided via: Cup;Straw Oral Phase Signs & Symptoms: Anterior loss/spillage Pharyngeal Phase Signs & Symptoms: Suspected delayed swallow initiation   GO    Harlon Ditty, MA CCC-SLP [phone]  Claudine Mouton 05/09/2013, 4:04 PM

## 2013-05-09 NOTE — Progress Notes (Signed)
Stroke Team Progress Note  HISTORY Vickie Hoffman is a 79 y.o. female who was having dinner on 05/07/2013 when she had vomiting. There was discussion of headache on EMS arrival, but the son was not aware of this. Following her vomiting, she was noted to have slurred speech and a right facial droop. She has a history of significant dementia   LKW: 7pm  tpa given: no, mild symptoms   SUBJECTIVE Just worked with PT who reports they could not stand her...  OBJECTIVE Most recent Vital Signs: Filed Vitals:   05/08/13 1400 05/08/13 2200 05/09/13 0145 05/09/13 0600  BP: 154/85 158/82 160/74 162/89  Pulse: 86 89 93 106  Temp: 98.4 F (36.9 C) 97.8 F (36.6 C) 97.6 F (36.4 C) 98.8 F (37.1 C)  TempSrc: Oral Oral Oral Oral  Resp: 18 18 18 18   Height:      Weight:      SpO2: 96% 98% 98% 99%   CBG (last 3)   Recent Labs  05/07/13 2223  GLUCAP 112*   IV Fluid Intake:   . sodium chloride 50 mL/hr (05/08/13 0936)   MEDICATIONS  . aspirin  300 mg Rectal Daily   Or  . aspirin  325 mg Oral Daily  . bisoprolol-hydrochlorothiazide  1 tablet Oral Daily  . buPROPion  200 mg Oral BID  . donepezil  10 mg Oral QHS  . enoxaparin (LOVENOX) injection  40 mg Subcutaneous Q24H  . venlafaxine XR  75 mg Oral Q breakfast   PRN:  LORazepam  Diet:  Dysphagia thin liquids Activity:  As tolerated DVT Prophylaxis:  Lovenox  CLINICALLY SIGNIFICANT STUDIES Basic Metabolic Panel:   Recent Labs Lab 05/07/13 2116  NA 141  K 3.9  CL 104  CO2 25  GLUCOSE 129*  BUN 33*  CREATININE 1.03  CALCIUM 9.6   Liver Function Tests: No results found for this basename: AST, ALT, ALKPHOS, BILITOT, PROT, ALBUMIN,  in the last 168 hours CBC:   Recent Labs Lab 05/07/13 2116  WBC 9.5  HGB 12.4  HCT 36.4  MCV 85.4  PLT 254   Coagulation:   Recent Labs Lab 05/07/13 2116  LABPROT 12.0  INR 0.89   Cardiac Enzymes:   Recent Labs Lab 05/07/13 2116  TROPONINI <0.30   Urinalysis:    Recent Labs Lab 05/09/13 0803  COLORURINE YELLOW  LABSPEC 1.013  PHURINE 8.5*  GLUCOSEU NEGATIVE  HGBUR MODERATE*  BILIRUBINUR NEGATIVE  KETONESUR 15*  PROTEINUR 30*  UROBILINOGEN 1.0  NITRITE POSITIVE*  LEUKOCYTESUR LARGE*   Lipid Panel    Component Value Date/Time   CHOL 234* 05/08/2013 0535   TRIG 88 05/08/2013 0535   HDL 72 05/08/2013 0535   CHOLHDL 3.3 05/08/2013 0535   VLDL 18 05/08/2013 0535   LDLCALC 144* 05/08/2013 0535   HgbA1C  Lab Results  Component Value Date   HGBA1C 5.6 05/08/2013    Urine Drug Screen:   No results found for this basename: labopia,  cocainscrnur,  labbenz,  amphetmu,  thcu,  labbarb    Alcohol Level: No results found for this basename: ETH,  in the last 168 hours  Dg Chest 2 View 05/08/2013  1.  Stable mild cardiomegaly and moderate hiatal hernia.  No definite acute disease.      Ct Head Wo Contrast 05/07/2013  1.  No acute abnormality. 2.  Progressive atrophy. 3.  Extensive chronic small vessel white matter ischemic changes. 4.  Interval old left basal ganglia and right  caudate head lacunar infarcts and old right occipital lobe infarct.  MRI of the brain  Tiny acute infarct posterior right pontomedullary junction.  MRA of the brain   2D Echocardiogram    Carotid Doppler    EKG  sinus rhythm rate 86 beats per minute.  Therapy Recommendations SNF  Physical Exam    General - the pleasant mildly confused 57-year-old female in no acute distress. Sleepy today. Skin - Warm and dry  NEUROLOGIC:   MENTAL STATUS: awake, mildly lethargic, some word finding difficulties,  follows simple commands.  CRANIAL NERVES: pupils equal and reactive to light,extraocular muscles intact, facial sensation intact, right facial droop, uvula midlinec, tongue midline left horner syndrome MOTOR: normal bulk and tone, Strength -4/5 throughout, refuses to follow commands well COORDINATION: finger-nose-finger slow but intact     ASSESSMENT [name] is a 79 y.o. female with baseline dementia who presents with vomiting, dysphagia, dysarthria, and right facial droop. TPA was not given secondary to mild symptoms. Imaging confirms and acute  posterior right pontomedullary junction infarct. Infarct felt to be thrombotic due to small vessel disease.  On aspirin 81 mg orally every day prior to admission. Now on aspirin 325 mg orally every day for secondary stroke prevention. Patient with resultant right facial droop and possibly mild aphasia, left horners, right hemiparesis. Work up completed   Baseline dementia  Anxiety and depression  Multiple old CVAs.  Hyperlipidemia cholesterol 234 and LDL 144. No statin given history of statin intolerance.  Probable dehydration with elevated BUN.  Hypertension  Hospital day # 2  TREATMENT/PLAN  Continue aspirin 325 mg orally every day for secondary stroke prevention.  PT recommends SNF - they spoke with husband and he will be unable to care for her in this state. husband agreeable.   Carotid Doppler studies, 2-D echocardiogram and MRI will not be completed given age and dementia - results would not alter treatment course.  No statin due to hx intolerance No further stroke workup indicated. Patient has a 10-15% risk of having another stroke over the next year, the highest risk is within 2 weeks of the most recent stroke/TIA (risk of having a stroke following a stroke or TIA is the same). Ongoing risk factor control by Primary Care Physician Stroke Service will sign off. Please call should any needs arise. Follow up Stroke Clinic  Annie Main, MSN, RN, ANVP-BC, ANP-BC, [facility] Pager: [phone] 05/09/2013 10:10 AM  I have personally obtained a history, examined the patient, evaluated imaging results, and formulated the assessment and plan of care. I agree with the above.  Lesly Dukes

## 2013-05-09 NOTE — Evaluation (Signed)
Physical Therapy Evaluation Patient Details Name: Vickie Hoffman MRN: [id number] DOB: 25-Apr-1920 Today's Date: 05/09/2013 Time: 4098-1191 PT Time Calculation (min): 31 min  PT Assessment / Plan / Recommendation Clinical Impression  This 79 y.o. Female with h/o dementia, admitted with vomiting, slurred speech, and Rt. Facial droop.  MRI revealed acute infarct posterior right pontomedullary junction. Presents to PT today with below impairments impacting prior level of function. Will benefit physical therapy in the acute setting to maxiize functional independence and facilitate safe d/c. Agree with SNF for d/c plan.     PT Assessment  Patient needs continued PT services    Follow Up Recommendations  SNF    Does the patient have the potential to tolerate intense rehabilitation      Barriers to Discharge Decreased caregiver support      Equipment Recommendations  None recommended by PT    Recommendations for Other Services     Frequency Min 3X/week    Precautions / Restrictions Precautions Precautions: Fall Precaution Comments: lethargic   Pertinent Vitals/Pain Denies pain     Mobility  Bed Mobility Bed Mobility: Rolling Right;Rolling Left Rolling Right: 2: Max assist Rolling Left: 2: Max assist Supine to Sit: 2: Max assist Sit to Supine: 1: +2 Total assist Sit to Supine: Patient Percentage: 40% Details for Bed Mobility Assistance: sequencing cues throughout, cues for attention to task, initiated lifting trunk but needs follow through  Transfers Transfers: Sit to Stand;Stand to Sit Sit to Stand: 1: +2 Total assist Sit to Stand: Patient Percentage: 20% Stand to Sit: 1: +2 Total assist Details for Transfer Assistance: facilitation for anterior translation of trunk over BOS as well as extension through hips/trunk Ambulation/Gait Ambulation/Gait Assistance: Not tested (comment) Ambulation/Gait Assistance Details: pt does not ambulated at baseline Modified Rankin  (Stroke Patients Only) Pre-Morbid Rankin Score: Moderately severe disability Modified Rankin: Severe disability    Exercises     PT Diagnosis: Generalized weakness;Hemiplegia dominant side;Altered mental status  PT Problem List: Decreased strength;Decreased activity tolerance;Decreased balance;Decreased cognition;Decreased safety awareness;Impaired sensation;Decreased mobility PT Treatment Interventions: DME instruction;Gait training;Functional mobility training;Therapeutic activities;Therapeutic exercise;Balance training;Neuromuscular re-education;Patient/family education   PT Goals Acute Rehab PT Goals PT Goal Formulation: With patient Time For Goal Achievement: 05/16/13 Potential to Achieve Goals: Fair Pt will go Supine/Side to Sit: with min assist PT Goal: Supine/Side to Sit - Progress: Goal set today Pt will go Sit to Supine/Side: with mod assist PT Goal: Sit to Supine/Side - Progress: Goal set today Pt will go Sit to Stand: with mod assist PT Goal: Sit to Stand - Progress: Goal set today Pt will go Stand to Sit: with mod assist PT Goal: Stand to Sit - Progress: Goal set today Pt will Transfer Bed to Chair/Chair to Bed: with mod assist PT Transfer Goal: Bed to Chair/Chair to Bed - Progress: Goal set today  Visit Information  Last PT Received On: 05/09/13 Assistance Needed: +2    Subjective Data  Subjective: Patient shakes her head no to most questions. She did say "It's too early" when I asked her if she could sit up. She was very lethargic and it was around 11:30.   Prior Functioning  Home Living Lives With: Spouse Available Help at Discharge: Family;Available 24 hours/day Type of Home: House Home Access: Ramped entrance Home Layout: One level Bathroom Shower/Tub: Nurse, adult Accessibility: Yes How Accessible: Accessible via wheelchair Home Adaptive Equipment: Bedside commode/3-in-1;Tub transfer bench;Wheelchair - manual Prior Function Level of  Independence: Needs assistance Needs Assistance: Bathing;Dressing;Grooming;Toileting;Meal Prep;Light Housekeeping;Transfers;Gait  Bath: Total Dressing: Total Feeding: Supervision/set-up Grooming: Moderate Toileting: Total Meal Prep: Total Light Housekeeping: Total Gait Assistance: non ambulatory Transfer Assistance: min A for pivot transfers.  Was able to sit on EOB with supervision Able to Take Stairs?: No Driving: No Comments: husband is 45 y/o Communication Communication: No difficulties    Cognition  Cognition Arousal/Alertness: Lethargic Behavior During Therapy: Flat affect Overall Cognitive Status: No family/caregiver present to determine baseline cognitive functioning    Extremity/Trunk Assessment Right Lower Extremity Assessment RLE ROM/Strength/Tone: Deficits;Unable to fully assess;Due to impaired cognition RLE ROM/Strength/Tone Deficits: limited knee extension approx 5-10 degrees from neutral via visual inspection Left Lower Extremity Assessment LLE ROM/Strength/Tone: Deficits;Unable to fully assess;Due to impaired cognition LLE ROM/Strength/Tone Deficits: limited knee extension approx 5-10 degrees from neutral via visual inspection Trunk Assessment Trunk Exceptions: pt with lumbar and thoracic flexion; Rt. lateral flexion with head and neck rotated and flexed to Rt.     Balance Static Sitting Balance Static Sitting - Balance Support: Bilateral upper extremity supported Static Sitting - Comment/# of Minutes: sat EOB with variable amount of assist needed, tends to fall posteriorly and to the right Static Standing Balance Static Standing - Balance Support: Right upper extremity supported Static Standing - Level of Assistance: 1: +2 Total assist (20-30) Static Standing - Comment/# of Minutes: flexed trunk and knees needing facilitation for knee/trunk and hip extension  End of Session PT - End of Session Equipment Utilized During Treatment: Gait belt Activity Tolerance:  Patient limited by fatigue Patient left: in bed;with call bell/phone within reach;with bed alarm set Nurse Communication: Mobility status  GP     [facility] HELEN 05/09/2013, 3:26 PM

## 2013-05-09 NOTE — Discharge Summary (Addendum)
Physician Discharge Summary  Patient ID: Vickie Hoffman MRN: [id number] DOB/AGE: [birth year] 79 y.o.  Admit date: 05/07/2013 Discharge date: 05/11/2013  Admission Diagnoses:  Discharge Diagnoses:  Principal Problem:   CVA (cerebral infarction)-tiny acute in the basal ganglia with expressive aphasia. Active Problems:   Dementia   HTN (hypertension)   Dysphagia Major depression Anxiety History of old stroke on the CT scan   Discharged Condition: good  Hospital Course: 79years old female with history of moderate to severe dementia depression admitted with right facial droop noted by her husband with some vomiting episode. Problem #1: patient had a CT scan in the emergency room showed old lacunar infarcts no acute finding, neurology was consulted admitted for acute stroke patient did not have any weakness and approximately she had some right facial droop and some expressive aphasia, MRI was obtained showed tiny acute infarct in the basal ganglion area consistent with the symptoms.patient was started on aspirin 325 mg daily. Speech physical therapy was consulted she did well on dysphagia one diet because of her dementia her cognitive ability and cooperation limited. Continue supportive care Problem #2: elevated blood pressures history of borderline hypertension blood pressures in the 160-180 range. Started on Ziac 2.5/6.25 one a day follow blood pressures at home and outpatient Problem #3: advanced dementia continue Aricept intermittent refusal of medications and confusion Problem #4 Maj. Depression continue on Effexor and Wellbutrin Her blood work including A1c blood chemistries blood counts normal UA was obtained-History of urinary incontinence; discontinue oxybutynin because of the dry mouth symptoms.  Ua showed UTI, urine culture >100,000 mixed species, cipro started 6/17 for 7  Days. Chest x-ray negative  Consults: neurology  Significant Diagnostic Studies: labs: blood work  including A1c blood chemistries and blood counts normal and radiology: CXR: normal, MRI: tiny acute infarct in the basal ganglion area and CT scan: oldlacunar infarct and age related changes  Treatments: IV hydration and anticoagulation: ASA  Discharge Exam: Blood pressure 162/89, pulse 106, temperature 98.8 F (37.1 C), temperature source Oral, resp. rate 18, height 5\' 4"  (1.626 m), weight 68.8 kg (151 lb 10.8 oz), SpO2 99.00%. General appearance: alert Resp: clear to auscultation bilaterally Cardio: regular rate and rhythm  Disposition: 01-Home or Self Care  Discharge Orders   Future Orders Complete By Expires     Diet - low sodium heart healthy  As directed     Diet - low sodium heart healthy  As directed     Discharge instructions  As directed     Comments:      Speech and PT at home    Discharge instructions  As directed     Comments:      Dysphagia 1 Diet - Puree with thin, no Straw    Increase activity slowly  As directed     Increase activity slowly  As directed         Medication List    STOP taking these medications       aspirin 81 MG chewable tablet     oxybutynin 5 MG tablet  Commonly known as:  DITROPAN      TAKE these medications       acetaminophen 500 MG tablet  Commonly known as:  TYLENOL  Take 500 mg by mouth 2 (two) times daily. 1 in the morning and 1 in the evening     aspirin 325 MG tablet  Take 1 tablet (325 mg total) by mouth daily.     bisoprolol-hydrochlorothiazide 2.5-6.25 MG per  tablet  Commonly known as:  ZIAC  Take 1 tablet by mouth daily.     buPROPion 200 MG 12 hr tablet  Commonly known as:  WELLBUTRIN SR  Take 200 mg by mouth 2 (two) times daily.     cholecalciferol 1000 UNITS tablet  Commonly known as:  VITAMIN D  Take 2 tablets (2,000 Units total) by mouth daily.     donepezil 10 MG tablet  Commonly known as:  ARICEPT  Take 10 mg by mouth at bedtime.     LORazepam 0.5 MG tablet  Commonly known as:  ATIVAN  Take 0.5 mg  by mouth at bedtime.     multivitamin per tablet  Take 1 tablet by mouth daily.     venlafaxine XR 75 MG 24 hr capsule  Commonly known as:  EFFEXOR-XR  Take 75 mg by mouth daily.           cipro 250 mg 1 tablet by mouth twice a day ending on 6/23     Follow-up Information   Follow up with Georgann Housekeeper, MD In 7 days.   Contact information:   [address] [place] [address] [phone]     discharge planning total 45 minutes.  SignedGeorgann Housekeeper 05/09/2013, 8:10 AM

## 2013-05-09 NOTE — Evaluation (Signed)
Occupational Therapy Evaluation Patient Details Name: Vickie Hoffman MRN: [id number] DOB: 08/26/1920 Today's Date: 05/09/2013 Time: 1478-2956 OT Time Calculation (min): 11 min  OT Assessment / Plan / Recommendation Clinical Impression   This 79 y.o. Female with h/o dementia, admitted with vomiting, slurred speech, and Rt. Facial droop.  MRI revealed acute infarct posterior right pontomedullary junction.  Pt presents to OT with lethargy, and significant balance deficits.  She requires mod- max A to maintain sitting balance EOB and max A to move sit to partial stand.  Unable to pivot her safely with +1 assistance.  She also is noted to close Lt. Eyelid - unsure if this is a component of ptosis, or possible the result of eye closing due to diplopia, as pt is unable to provide info.  Spoke with spouse via phone.  He reports she was total A for all BADLs except self feeding, and simple grooming tasks, but was able to pivot to [facility] and w/c with min A, and was able to sit EOB with supervision.  At this time, he does not feel he is able to provide level of physical assistance she is requiring.  At this time, recommend SNF level rehab.  RN made aware and VM left for Dr. Donette Larry.     OT Assessment  Patient needs continued OT Services    Follow Up Recommendations  SNF    Barriers to Discharge Decreased caregiver support Husband unable to provide required level of physical assistance for discharge home  Equipment Recommendations  None recommended by OT    Recommendations for Other Services    Frequency  Min 2X/week    Precautions / Restrictions Precautions Precautions: Fall Restrictions Weight Bearing Restrictions: No       ADL  Eating/Feeding: Maximal assistance Where Assessed - Eating/Feeding: Bed level Grooming: Wash/dry hands;Wash/dry face;Brushing hair;+1 Total assistance Where Assessed - Grooming: Supported sitting Upper Body Bathing: +1 Total assistance Where Assessed - Upper Body  Bathing: Supine, head of bed up Lower Body Bathing: +1 Total assistance Where Assessed - Lower Body Bathing: Supine, head of bed up;Rolling right and/or left Upper Body Dressing: +1 Total assistance Where Assessed - Upper Body Dressing: Supine, head of bed up;Supported sitting Lower Body Dressing: +1 Total assistance Where Assessed - Lower Body Dressing: Rolling right and/or left;Supine, head of bed flat Toilet Transfer: +1 Total assistance (unable to pivot) Toileting - Clothing Manipulation and Hygiene: +1 Total assistance Where Assessed - Toileting Clothing Manipulation and Hygiene: Supine, head of bed flat;Rolling right and/or left Transfers/Ambulation Related to ADLs: Pt moves to partial stand with max A.  Unable to achieve full standing.  Leans heavily to Rt. ADL Comments: Pt lethargic. Unable to engage in ADL tasks.  Spoke with husband on phone.  He has been providing 24 hour assistance, with intermittent assist from sons.  He does not feel he can handle her at the current level of care.  Voice mail left for MD and RN notified    OT Diagnosis: Generalized weakness;Cognitive deficits;Disturbance of vision  OT Problem List: Decreased strength;Decreased activity tolerance;Impaired balance (sitting and/or standing);Impaired vision/perception;Decreased cognition;Decreased knowledge of use of DME or AE OT Treatment Interventions: Self-care/ADL training;Neuromuscular education;Therapeutic activities;Balance training;Patient/family education;Visual/perceptual remediation/compensation   OT Goals Acute Rehab OT Goals OT Goal Formulation: With family Time For Goal Achievement: 05/16/13 Potential to Achieve Goals: Good ADL Goals Pt Will Perform Eating: with min assist;Supported;Sitting, chair ADL Goal: Eating - Progress: Goal set today Pt Will Perform Grooming: with min assist;Sitting, chair ADL Goal:  Grooming - Progress: Goal set today Pt Will Transfer to Toilet: with mod assist;Stand pivot  transfer;3-in-1 ADL Goal: Toilet Transfer - Progress: Goal set today Additional ADL Goal #1: Pt will maintain EOB sitting x 10 mins with min A to assist with BADLs ADL Goal: Additional Goal #1 - Progress: Goal set today  Visit Information  Last OT Received On: 05/09/13 Assistance Needed: +2    Subjective Data  Subjective: "Oh, let me lie down.  I'm so tired" Patient Stated Goal: To lie down   Prior Functioning     Home Living Lives With: Spouse Available Help at Discharge: Family;Available 24 hours/day Type of Home: House Home Access: Ramped entrance Home Layout: One level Bathroom Shower/Tub: Nurse, adult Accessibility: Yes How Accessible: Accessible via wheelchair Home Adaptive Equipment: Bedside commode/3-in-1;Tub transfer bench;Wheelchair - manual Prior Function Level of Independence: Needs assistance Needs Assistance: Bathing;Dressing;Grooming;Toileting;Meal Prep;Light Housekeeping;Transfers;Gait Bath: Total Dressing: Total Feeding: Supervision/set-up Grooming: Moderate Toileting: Total Meal Prep: Total Light Housekeeping: Total Gait Assistance: non ambulatory Transfer Assistance: min A for pivot transfers.  Was able to sit on EOB with supervision Able to Take Stairs?: No Driving: No Comments: Husband is 73 y.o.  Communication Communication: No difficulties         Vision/Perception Vision - Assessment Vision Assessment: Vision not tested Additional Comments: Pt unable to participate in visual assesment.  Pt noted to spontaneously close Lt. eye frequently - question if she is experiencing diplopia, but is unable to tell therapist, or if this is due to pstosis.   Perception Perception: Not tested (pt unable to particiapte in assesment due to lethargy) Praxis Praxis: Impaired Praxis Impairment Details: Motor planning   Cognition  Cognition Arousal/Alertness: Lethargic Behavior During Therapy: Flat affect Overall Cognitive Status: No  family/caregiver present to determine baseline cognitive functioning (pt with h/o dementia and very lethargic during eval)    Extremity/Trunk Assessment Right Upper Extremity Assessment RUE ROM/Strength/Tone: Within functional levels Left Upper Extremity Assessment LUE ROM/Strength/Tone: Deficits LUE ROM/Strength/Tone Deficits: Active shoulder flexion to ~85*; PROM to ~ 90*.  Suspect long standing deficit Trunk Assessment Trunk Assessment: Other exceptions Trunk Exceptions: pt with lumbar and thoracic flexion; Rt. lateral flexion with head and neck rotated and flexed to Rt.       Mobility Bed Mobility Bed Mobility: Supine to Sit;Sitting - Scoot to Edge of Bed;Sit to Supine Supine to Sit: 2: Max assist Sitting - Scoot to Edge of Bed: 1: +1 Total assist Sit to Supine: 2: Max assist Details for Bed Mobility Assistance: Pt requires assist for all aspects of bed mobilty.  She is able to initiate lifting trunk and moving feet on and off bed Transfers Transfers: Sit to Stand;Stand to Sit Sit to Stand: 2: Max assist;From bed Stand to Sit: 2: Max assist;With upper extremity assist;To bed Details for Transfer Assistance: Unable to achieve full standing.  Pt unable to pivot.  Leans to Rt.      Exercise     Balance Balance Balance Assessed: Yes Static Sitting Balance Static Sitting - Balance Support: Feet supported;Left upper extremity supported Static Sitting - Level of Assistance: 2: Max assist;3: Mod assist Static Sitting - Comment/# of Minutes: Pt sat EOB x 7 mins with heavy lean to Rt. Variable amount of assistance required mod - max A Static Standing Balance Static Standing - Balance Support: No upper extremity supported Static Standing - Level of Assistance: 2: Max assist Static Standing - Comment/# of Minutes: unable to achieve full standing.  Leans to Rt.  End of Session OT - End of Session Activity Tolerance: Patient limited by fatigue Patient left: in bed;with call bell/phone  within reach;with bed alarm set Nurse Communication: Mobility status;Other (comment) (conversation with spouse and need for SNF)  GO     Katherleen Folkes M 05/09/2013, 10:48 AM

## 2013-05-10 DIAGNOSIS — N390 Urinary tract infection, site not specified: Secondary | ICD-10-CM | POA: Diagnosis not present

## 2013-05-10 LAB — URINE CULTURE: Colony Count: >=100000

## 2013-05-10 MED ORDER — CIPROFLOXACIN HCL 250 MG PO TABS: 250.0000 mg | ORAL_TABLET | Freq: Two times a day (BID) | ORAL | Status: AC

## 2013-05-10 MED ORDER — LORAZEPAM 0.5 MG PO TABS: 0.5000 mg | ORAL_TABLET | Freq: Every day | ORAL | Status: DC

## 2013-05-10 MED ORDER — DEXTROSE 5 % IV SOLN
1.0000 g | Freq: Once | INTRAVENOUS | Status: AC
  Administered 2013-05-10: 1 g via INTRAVENOUS
  Filled 2013-05-10: qty 10

## 2013-05-10 NOTE — Progress Notes (Signed)
Subjective: More awake today  Objective: Vital signs in last 24 hours: Temp:  [97 F (36.1 C)-98 F (36.7 C)] 97.9 F (36.6 C) (06/17 0525) Pulse Rate:  [77-105] 84 (06/17 0525) Resp:  [16-19] 16 (06/17 0525) BP: (136-170)/(54-93) 170/75 mmHg (06/17 0525) SpO2:  [95 %-99 %] 99 % (06/17 0525) Weight change:  Last BM Date: 05/09/13  Intake/Output from previous day: 06/16 0701 - 06/17 0700 In: 880 [P.O.:280; I.V.:600] Out: -  Intake/Output this shift:    General appearance: alert Resp: clear to auscultation bilaterally GI: soft, non-tender; bowel sounds normal; no masses,  no organomegaly Neurologic: Grossly normal  Lab Results:  Recent Labs  05/07/13 2116  WBC 9.5  HGB 12.4  HCT 36.4  PLT 254   BMET  Recent Labs  05/07/13 2116  NA 141  K 3.9  CL 104  CO2 25  GLUCOSE 129*  BUN 33*  CREATININE 1.03  CALCIUM 9.6    Studies/Results: Mr Brain Wo Contrast  05/08/2013   *RADIOLOGY REPORT*  Clinical Data: Question of right facial weakness.  Poor swallowing mechanism.  Hypertension.  MRI HEAD WITHOUT CONTRAST  Technique:  Multiplanar, multiecho pulse sequences of the brain and surrounding structures were obtained according to standard protocol without intravenous contrast.  Comparison: 05/07/2013 CT.  08/25/2009 MR.  Findings: Motion degraded exam.  Tiny acute infarct posterior right pontomedullary junction.  Scattered tiny blood breakdown products including that involving the thalami, pons and cerebellum consistent with result of prior hemorrhagic ischemia.  Remote right parietal lobe infarct with encephalomalacia.  Remote bilateral cerebellar, thalamic, basal ganglia, pontine and corona radiata infarcts.  Prominent small vessel disease type changes.  Global atrophy.  Ventricular prominence (slightly more notable than on prior exam) may be related to atrophy although difficult to exclude mild component hydrocephalus.  No intracranial mass lesion detected on this  unenhanced exam.  Cervical spondylotic changes with spinal stenosis C3-4 and C4-5. Transverse ligament hypertrophy with mild erosion of the dens.  Cervical medullary junction, pituitary region, pineal region and orbital structures unremarkable.  Major intracranial vascular structures are patent.  Findings suggest narrowing of the left internal carotid artery cavernous segment.  IMPRESSION:  Tiny acute infarct posterior right pontomedullary junction.  Please see above discussion.   Original Report Authenticated By: Lacy Duverney, M.D.    Medications: I have reviewed the patient's current medications.  Assessment/Plan: Expressive Aphasia/ with right facial droop- small acute pontomedullary infract  ASA 325 mg daily  Dysphagia- D1 diet HTN: Ziac started Dementia moderate with Depression- continue   meds- MS at patients baseline  Speech/PT/ OT- weakness with gait abormal. UTI- culture mixed flora-  cipro Po, dose of IV Rocephin today SNF today   LOS: 3 days   Vickie Hoffman 05/10/2013, 7:43 AM

## 2013-05-10 NOTE — Progress Notes (Signed)
Physical Therapy Treatment Patient Details Name: Vickie Hoffman MRN: [id number] DOB: Sep 15, 1920 Today's Date: 05/10/2013 Time: 4098-1191 PT Time Calculation (min): 25 min  PT Assessment / Plan / Recommendation Comments on Treatment Session  This 79 y.o. Female with h/o dementia, admitted with vomiting, slurred speech, and Rt. Facial droop.  MRI revealed acute infarct posterior right pontomedullary junction. Much improved today with more participation and arousal. Focused on transfers and strengthening. Remains confused, unsure of what is baseline.     Follow Up Recommendations  SNF     Does the patient have the potential to tolerate intense rehabilitation     Barriers to Discharge        Equipment Recommendations  None recommended by PT    Recommendations for Other Services    Frequency Min 3X/week   Plan Discharge plan remains appropriate;Frequency remains appropriate    Precautions / Restrictions Precautions Precautions: Fall Restrictions Weight Bearing Restrictions: No Other Position/Activity Restrictions: does not ambulate at baseline   Pertinent Vitals/Pain Denies pain    Mobility  Bed Mobility Bed Mobility: Supine to Sit Supine to Sit: 3: Mod assist Details for Bed Mobility Assistance: mod facilitation for follow through Transfers Transfers: Sit to Stand;Stand to Sit Sit to Stand: 1: +2 Total assist;3: Mod assist Sit to Stand: Patient Percentage: 60% Details for Transfer Assistance: sit<>stand initially with +2 from bed; once seated in recliner sit<>stand x5 for strengthening with modA needing facilitation for anterior translation of trunk as well as to maintain this during extension Ambulation/Gait Ambulation/Gait Assistance: 1: +2 Total assist Ambulation/Gait: Patient Percentage: 60% Ambulation Distance (Feet): 8 Feet Assistive device: 2 person hand held assist Ambulation/Gait Assistance Details: facilitation for stability bilaterally Gait Pattern:  Step-through pattern;Shuffle;Trunk flexed;Decreased stride length Gait velocity: decreased Stairs: No Modified Rankin (Stroke Patients Only) Modified Rankin: Moderately severe disability      PT Goals Acute Rehab PT Goals PT Goal: Supine/Side to Sit - Progress: Progressing toward goal Pt will go Sit to Stand: with min assist PT Goal: Sit to Stand - Progress: Updated due to goal met Pt will go Stand to Sit: with min assist PT Goal: Stand to Sit - Progress: Updated due to goals met PT Transfer Goal: Bed to Chair/Chair to Bed - Progress: Progressing toward goal  Visit Information  Last PT Received On: 05/10/13 Assistance Needed: +2 (could progress to +1? soon)    Subjective Data  Subjective: You two girls should come home with me.    Cognition  Cognition Arousal/Alertness: Awake/alert Behavior During Therapy: WFL for tasks assessed/performed Overall Cognitive Status: No family/caregiver present to determine baseline cognitive functioning    Balance     End of Session PT - End of Session Equipment Utilized During Treatment: Gait belt Activity Tolerance: Patient tolerated treatment well Patient left: in chair;with call bell/phone within reach;with chair alarm set Nurse Communication: Mobility status   GP     [facility] HELEN 05/10/2013, 3:29 PM

## 2013-05-10 NOTE — Evaluation (Signed)
Speech Language Pathology Evaluation Patient Details Name: Vickie Hoffman MRN: [id number] DOB: November 08, 1920 Today's Date: 05/10/2013 Time: 7829-5621 SLP Time Calculation (min): 22 min  Problem List:  Patient Active Problem List   Diagnosis Date Noted  . UTI (urinary tract infection) 05/10/2013  . CVA (cerebral infarction) 05/08/2013  . Dysphagia 05/08/2013  . Dehydration 06/25/2012  . Fracture Of Superior Pubic Ramus 06/23/2012  . Bundle branch block 06/23/2012  . Anxiety and depression   . Sinus tachycardia 10/08/2011  . Femur fracture, right 10/03/2011  . Dementia 10/03/2011  . HTN (hypertension) 10/03/2011  . ARF (acute renal failure) 10/03/2011  . Anemia 10/03/2011   Past Medical History:  Past Medical History  Diagnosis Date  . Dementia   . Anxiety and depression    Past Surgical History:  Past Surgical History  Procedure Laterality Date  . Compression hip screw  10/03/2011    Procedure: COMPRESSION HIP;  Surgeon: Kathryne Hitch;  Location: [facility] OR;  Service: Orthopedics;  Laterality: Right;  . Fracture surgery     HPI:  Vickie Hoffman is a 79 y.o. female who was having dinner on 05/07/2013 when she had vomiting. There was discussion of headache on EMS arrival, but the son was not aware of this. Following her vomiting, she was noted to have slurred speech and a right facial droop. She has a history of significant dementia . MRI shows Tiny acute infarct posterior right pontomedullary junction.   Assessment / Plan / Recommendation Clinical Impression  Pt with limited participation in cognitive lingusitic eval. Pt very resistant to any intervention. She did communicate at phrase level to say "Get out of here" "you dont wnat to be here with me." Then, "Don't go." She also demonstrated comprehension of some questions by giving appropriate response to choice questions.  Otherwise pt yelled out and repeated "No" or "uh-uh" to most attempt to interact. RN reports pt  verbalizing appropriately in am, making requests, asking for help, engaging in pleasantries. Do not suspect aphasia in this pt especially with brainstem infarct. Pt likely with worsening of dementia due to unfamiliar location and acute illness. Pt to d/c to SNF, recommend f/u with SLP at that setting.     SLP Assessment  All further Speech Lanaguage Pathology  needs can be addressed in the next venue of care    Follow Up Recommendations  Skilled Nursing facility    Frequency and Duration        Pertinent Vitals/Pain NA   SLP Goals     SLP Evaluation Prior Functioning  Cognitive/Linguistic Baseline: Baseline deficits Baseline deficit details: dementia Type of Home: House Lives With: Spouse Available Help at Discharge: Family;Available 24 hours/day Vocation: Retired   IT consultant  Overall Cognitive Status: No family/caregiver present to determine baseline cognitive functioning Arousal/Alertness: Awake/alert Orientation Level: Other (comment) (Pt will not respond) Attention: Focused;Sustained Focused Attention: Appears intact Sustained Attention: Impaired Sustained Attention Impairment: Verbal basic;Functional basic Memory:  (UTA) Awareness: Impaired Awareness Impairment: Intellectual impairment;Emergent impairment Problem Solving: Impaired Problem Solving Impairment: Verbal basic;Functional basic Behaviors: Poor frustration tolerance    Comprehension  Auditory Comprehension Overall Auditory Comprehension: Other (comment) (Pt will not participate, responds "NO" )    Expression Verbal Expression Overall Verbal Expression: Impaired Initiation: No impairment Automatic Speech:  ("Get out of here") Interfering Components: Premorbid deficit;Speech intelligibility   Oral / Motor Oral Motor/Sensory Function Overall Oral Motor/Sensory Function: Impaired Labial ROM: Reduced right Labial Symmetry: Abnormal symmetry right Labial Strength: Reduced Labial Sensation:  Reduced Lingual  ROM: Reduced right Lingual Symmetry: Abnormal symmetry right Lingual Strength: Other (Comment) (UTA) Facial ROM: Reduced right Facial Symmetry: Right droop Facial Strength: Reduced Facial Sensation: Reduced Motor Speech Overall Motor Speech: Impaired Respiration: Within functional limits Phonation: Normal Resonance: Within functional limits Articulation: Impaired Level of Impairment: Word Intelligibility: Intelligibility reduced Word: 75-100% accurate Phrase: 50-74% accurate   GO    Harlon Ditty, MA CCC-SLP [phone]  Claudine Mouton 05/10/2013, 10:50 AM

## 2013-05-10 NOTE — Progress Notes (Signed)
Speech Language Pathology Dysphagia Treatment Patient Details Name: Vickie Hoffman MRN: [id number] DOB: 1920/06/26 Today's Date: 05/10/2013 Time: 4098-1191 SLP Time Calculation (min): 22 min  Assessment / Plan / Recommendation Clinical Impression  Pt again with limited participation in PO trials. Pt refused PO, then held and spit out sips of orange juice. She did have two swallow responses with no evidence of aspiration. Discussed with RN who reports that pt was participatory with am meal though she needed repositioning to decrease anterior spillage and min verbal cues due to prolonged oral phase. There was no coughing of choking. Pts mentation is a barrier to functional consumption of PO. SLP has not been able to truly evaluate pts function but RN reprots she is tolerating meals. Will not alter diet, but she should have f/u with SLP at next level of care.     Diet Recommendation  Continue with Current Diet: Dysphagia 1 (puree);Thin liquid    SLP Plan Continue with current plan of care   Pertinent Vitals/Pain NA   Swallowing Goals  SLP Swallowing Goals Patient will utilize recommended strategies during swallow to increase swallowing safety with: Total assistance Swallow Study Goal #2 - Progress: Progressing toward goal  General Temperature Spikes Noted: No Respiratory Status: Room air Behavior/Cognition: Confused;Uncooperative;Doesn't follow directions;Decreased sustained attention Oral Cavity - Dentition: Adequate natural dentition Patient Positioning: Upright in bed  Oral Cavity - Oral Hygiene     Dysphagia Treatment Treatment focused on: Skilled observation of diet tolerance Treatment Methods/Modalities: Skilled observation Patient observed directly with PO's: Yes Type of PO's observed: Thin liquids;Dysphagia 1 (puree) Feeding: Total assist Liquids provided via: Cup Oral Phase Signs & Symptoms: Anterior loss/spillage;Right pocketing;Left pocketing;Prolonged bolus  formation;Prolonged oral phase Type of cueing: Verbal Amount of cueing: Maximal   GO    Harlon Ditty, MA CCC-SLP [phone]  Claudine Mouton 05/10/2013, 10:56 AM

## 2013-05-10 NOTE — Clinical Social Work Note (Signed)
Clinical Social Worker continuing to follow patient and family for support and discharge planning.  Patient husband is agreeable to placement at [facility] and [facility] has extended the bed offer.  Patient husband and son plan to provide transportation to patient at discharge.  Patient husband to complete paperwork for facility tomorrow.  CSW remains available for emotional support and to facilitate patient discharge needs tomorrow 06/18.  Macario Golds, [place] [phone]

## 2013-05-11 NOTE — Progress Notes (Signed)
Subjective: No new complaints  Objective: Vital signs in last 24 hours: Temp:  [98 F (36.7 C)-98.4 F (36.9 C)] 98.4 F (36.9 C) (06/17 2359) Pulse Rate:  [84-88] 84 (06/17 2359) Resp:  [20-24] 24 (06/17 2359) BP: (156-174)/(72-94) 174/94 mmHg (06/17 2359) SpO2:  [99 %] 99 % (06/17 2359) Weight change:  Last BM Date: 05/09/13  Intake/Output from previous day:   Intake/Output this shift:    General appearance: alert Resp: clear to auscultation bilaterally Cardio: regular rate and rhythm, S1, S2 normal, no murmur, click, rub or gallop  Lab Results: No results found for this basename: WBC, HGB, HCT, PLT,  in the last 72 hours BMET No results found for this basename: NA, K, CL, CO2, GLUCOSE, BUN, CREATININE, CALCIUM,  in the last 72 hours  Studies/Results: No results found.  Medications: I have reviewed the patient's current medications.  Assessment/Plan: Expressive Aphasia/ with right facial droop- small acute pontomedullary infract  ASA 325 mg daily  Dysphagia- D1 diet  HTN: Ziac started  Dementia moderate with Depression- continue meds- MS at patients baseline  Speech/PT/ OT- weakness with gait abormal.  UTI- culture mixed flora- cipro Po, dose of IV Rocephin today  SNF today    LOS: 4 days   Vickie Hoffman JOSEPH 05/11/2013, 7:30 AM

## 2013-05-11 NOTE — Progress Notes (Signed)
RN attempted to call report to [facility]. Only able to speak with Diplomatic Services operational officer. No RN answered phone call times three and a page from the secretary.

## 2013-05-11 NOTE — Clinical Social Work Note (Signed)
Clinical Social Worker facilitated patient discharge including contacting patient family and facility to confirm patient discharge plans.  Clinical information faxed to facility and family agreeable with plan.  CSW arranged transport via family to [facility] .  RN to call report prior to discharge.  Clinical Social Worker will sign off for now as social work intervention is no longer needed. Please consult [place] again if new need arises.  Macario Golds, [place] [phone]

## 2013-05-11 NOTE — Progress Notes (Signed)
Pt d/c to snf by car with family. Assessment stable. Family received packet to take to SNF.

## 2013-05-12 ENCOUNTER — Other Ambulatory Visit: Payer: Self-pay | Admitting: Geriatric Medicine

## 2013-05-12 MED ORDER — LORAZEPAM 0.5 MG PO TABS: 0.5000 mg | ORAL_TABLET | Freq: Every day | ORAL | Status: DC

## 2013-05-13 ENCOUNTER — Non-Acute Institutional Stay (SKILLED_NURSING_FACILITY): Payer: Medicare Other | Admitting: Internal Medicine

## 2013-05-13 ENCOUNTER — Encounter: Payer: Self-pay | Admitting: Internal Medicine

## 2013-05-13 DIAGNOSIS — I6350 Cerebral infarction due to unspecified occlusion or stenosis of unspecified cerebral artery: Secondary | ICD-10-CM

## 2013-05-13 DIAGNOSIS — R1310 Dysphagia, unspecified: Secondary | ICD-10-CM

## 2013-05-13 DIAGNOSIS — F0390 Unspecified dementia without behavioral disturbance: Secondary | ICD-10-CM

## 2013-05-13 DIAGNOSIS — I10 Essential (primary) hypertension: Secondary | ICD-10-CM

## 2013-05-13 DIAGNOSIS — I639 Cerebral infarction, unspecified: Secondary | ICD-10-CM

## 2013-05-13 DIAGNOSIS — F329 Major depressive disorder, single episode, unspecified: Secondary | ICD-10-CM

## 2013-05-13 DIAGNOSIS — N390 Urinary tract infection, site not specified: Secondary | ICD-10-CM

## 2013-05-13 DIAGNOSIS — H5789 Other specified disorders of eye and adnexa: Secondary | ICD-10-CM

## 2013-05-13 DIAGNOSIS — F419 Anxiety disorder, unspecified: Secondary | ICD-10-CM

## 2013-05-13 DIAGNOSIS — F341 Dysthymic disorder: Secondary | ICD-10-CM

## 2013-05-13 NOTE — Progress Notes (Signed)
Patient ID: Vickie Hoffman, female   DOB: 19-Sep-1920, 79 y.o.   MRN: [id number]    PCP: Georgann Housekeeper, MD  Code Status: full code  Allergies  Allergen Reactions  . Statins Other (See Comments)    Muscle cramps, pains, leg weakness  . Sulfa Antibiotics Other (See Comments)    Unknown     Chief Complaint: new admit post hospitalization 05/07/13- 05/11/13  HPI:  79 y/o female patient is here for STR post hospital admission on above date with CVA and has residual expressive aphasia. She has history of dementia, HTN, depression and anxiety. She also has residual right sided facial droop. Neurology was consulted. She was started on asa 325 mg daily. Speech therapy and physical therapy team were consulted. With elevated BP readings she was started on ziac. She also was diagnosed with UTI and started on ciprofloxacin. She was seen in her room today. She has discomfort in her right eye with yellow drainage as per staff. She complaints of the same. She has started working with PT and SLP team. Her cognitive impairment in setting of dementia and her new stroke with expressive aphasia is making it difficult for full participation. Unable to obtain full ROS from patient for this reason. No fever, chills, abdominal pain, vomiting, chest pain, respiratory distress reported by staff.  Review of Systems:  See hpi  Past Medical History  Diagnosis Date  . Dementia   . Anxiety and depression    Past Surgical History  Procedure Laterality Date  . Compression hip screw  10/03/2011    Procedure: COMPRESSION HIP;  Surgeon: Kathryne Hitch;  Location: [facility] OR;  Service: Orthopedics;  Laterality: Right;  . Fracture surgery     Social History:   reports that she has never smoked. She does not have any smokeless tobacco history on file. She reports that she does not drink alcohol or use illicit drugs.  Family History  Problem Relation Age of Onset  . CVA Mother   . CAD Mother      Medications: Patient's Medications  New Prescriptions   No medications on file  Previous Medications   ACETAMINOPHEN (TYLENOL) 500 MG TABLET    Take 500 mg by mouth 2 (two) times daily. 1 in the morning and 1 in the evening   ASPIRIN 325 MG TABLET    Take 1 tablet (325 mg total) by mouth daily.   BISOPROLOL-HYDROCHLOROTHIAZIDE (ZIAC) 2.5-6.25 MG PER TABLET    Take 1 tablet by mouth daily.   BUPROPION (WELLBUTRIN SR) 200 MG 12 HR TABLET    Take 200 mg by mouth 2 (two) times daily.    CHOLECALCIFEROL (VITAMIN D) 1000 UNITS TABLET    Take 2 tablets (2,000 Units total) by mouth daily.   CIPROFLOXACIN (CIPRO) 250 MG TABLET    Take 1 tablet (250 mg total) by mouth 2 (two) times daily.   DONEPEZIL (ARICEPT) 10 MG TABLET    Take 10 mg by mouth at bedtime.     LORAZEPAM (ATIVAN) 0.5 MG TABLET    Take 1 tablet (0.5 mg total) by mouth at bedtime.   MULTIVITAMIN (THERAGRAN) PER TABLET    Take 1 tablet by mouth daily.     VENLAFAXINE (EFFEXOR-XR) 75 MG 24 HR CAPSULE    Take 75 mg by mouth daily.    Modified Medications   No medications on file  Discontinued Medications   No medications on file    Physical Exam: Filed Vitals:   05/13/13 1456  BP:  156/70  Pulse: 77  Temp: 96.5 F (35.8 C)  Resp: 18   gen- elderly female in NAD HEENT- mild pallor, redness in conjunctiva of right eye, clear yellow drainage from lateral punctum,right facial droop, poor oral hygiene, expressive aphasia cvs- n s1,s2, rrr respi- b/l CTA abdo- bs+, soft, non tender Ext- able to move all 4, weakness present Psych- aao x 2, flat affect Neuro- strength 4/5 in extremities, right facial droop present   Labs reviewed: Basic Metabolic Panel:  Recent Labs  47/82/95 0724 06/23/12 1456 06/24/12 0346 05/07/13 2116  NA 138  --  136 141  K 4.8  --  4.0 3.9  CL 99  --  100 104  CO2 27  --  23 25  GLUCOSE 119*  --  98 129*  BUN 30*  --  27* 33*  CREATININE 0.96 1.00 1.02 1.03  CALCIUM 9.6  --  8.9 9.6    Liver Function Tests:  Recent Labs  06/23/12 0724  AST 31  ALT 32  ALKPHOS 169*  BILITOT 0.7  PROT 6.7  ALBUMIN 3.5    Recent Labs  06/23/12 0724  LIPASE 15   No results found for this basename: AMMONIA,  in the last 8760 hours CBC:  Recent Labs  06/23/12 0724  06/23/12 1456 06/24/12 0346 05/07/13 2116  WBC 9.5  < > 9.1 10.8* 9.5  NEUTROABS 7.5  --   --   --   --   HGB 11.2*  < > 10.3* 10.5* 12.4  HCT 33.6*  < > 30.7* 31.0* 36.4  MCV 88.4  < > 89.0 86.8 85.4  PLT PLATELET CLUMPS NOTED ON SMEAR, COUNT APPEARS ADEQUATE  < > 170 197 254  < > = values in this interval not displayed. Cardiac Enzymes:  Recent Labs  06/23/12 1141 06/23/12 1957 06/24/12 0345 05/07/13 2116  CKTOTAL 69 89 127  --   CKMB 1.9 2.5 3.0  --   TROPONINI <0.30 <0.30 <0.30 <0.30   BNP: No components found with this basename: POCBNP,  CBG:  Recent Labs  05/07/13 2223  GLUCAP 112*    Radiological Exams: CXR: normal, MRI: tiny acute infarct in the basal ganglion area and CT scan: oldlacunar infarct and age related changes  Assessment/Plan  CVA- s/p new CVA. Continue ASA 325 mg daily. To keep BP under control- SBP elevated in facility. Ordered Skin care, pressure ulcer prophylaxis, aspiration precautions, oral hygiene  HTN- will increase bisoprolol-hctz to 5/ 12.5 mg po daily for now and reassess with holding parameters  Dementia- persists. Continue aricept for now   uti- complete course of ciprofloxacin , encourage hydration  Dysphagia- aspiration precautions and continue dysphagia diet. Follow with SLP team  Depression- mood appears stable today. Continue effexor, bupropion and ativan home dosage. Monitor clinically  Right eye redness- will provide artificial tear to help with dry eyes and treat her prophylactically with antibiotic eye drop for 5 days. Will have an eye patch placed to avoid her rubbing her eyes frequently  Family/ staff Communication: reviewed care plan with  patient and nursing supervisor   Goals of care: to undergo rehabilitation   Labs/tests ordered: cbc, cmp

## 2013-06-16 ENCOUNTER — Non-Acute Institutional Stay (SKILLED_NURSING_FACILITY): Payer: Medicare Other | Admitting: Adult Health

## 2013-06-16 ENCOUNTER — Encounter: Payer: Self-pay | Admitting: Adult Health

## 2013-06-16 DIAGNOSIS — I10 Essential (primary) hypertension: Secondary | ICD-10-CM

## 2013-06-16 DIAGNOSIS — I639 Cerebral infarction, unspecified: Secondary | ICD-10-CM

## 2013-06-16 DIAGNOSIS — I6350 Cerebral infarction due to unspecified occlusion or stenosis of unspecified cerebral artery: Secondary | ICD-10-CM

## 2013-06-16 NOTE — Progress Notes (Signed)
Patient ID: Vickie Hoffman, female   DOB: 1920/03/29, 80 y.o.   MRN: [id number] [facility]  Allergies  Allergen Reactions  . Statins Other (See Comments)    Muscle cramps, pains, leg weakness  . Sulfa Antibiotics Other (See Comments)    Unknown      Chief Complaint  Patient presents with  . Discharge Note    HPI: She is being discharged to home with family and home health for pt/o/st/cna; will not need dme. She had been hospitalized for an cva. She is not able to provide for her own adl's at this time and will dependent upon the care of her family.  Past Medical History  Diagnosis Date  . Dementia   . Anxiety and depression     Past Surgical History  Procedure Laterality Date  . Compression hip screw  10/03/2011    Procedure: COMPRESSION HIP;  Surgeon: Kathryne Hitch;  Location: [facility] OR;  Service: Orthopedics;  Laterality: Right;  . Fracture surgery      VITAL SIGNS BP 134/68  Pulse 72  Ht 5\' 4"  (1.626 m)  Wt 134 lb 12.8 oz (61.145 kg)  BMI 23.13 kg/m2   Patient's Medications  New Prescriptions   No medications on file  Previous Medications   ACETAMINOPHEN (TYLENOL) 500 MG TABLET    Take 500 mg by mouth 2 (two) times daily. 1 in the morning and 1 in the evening   ASPIRIN 325 MG TABLET    Take 1 tablet (325 mg total) by mouth daily.   BUPROPION (WELLBUTRIN SR) 200 MG 12 HR TABLET    Take 200 mg by mouth 2 (two) times daily.    DONEPEZIL (ARICEPT) 10 MG TABLET    Take 10 mg by mouth at bedtime.     MULTIVITAMIN (THERAGRAN) PER TABLET    Take 1 tablet by mouth daily.     VENLAFAXINE (EFFEXOR-XR) 75 MG 24 HR CAPSULE    Take 75 mg by mouth daily.     VITAMIN D, ERGOCALCIFEROL, (DRISDOL) 50000 UNITS CAPS    Take 50,000 Units by mouth every 7 (seven) days.  Modified Medications   Modified Medication Previous Medication   BISOPROLOL-HYDROCHLOROTHIAZIDE (ZIAC) 2.5-6.25 MG PER TABLET bisoprolol-hydrochlorothiazide (ZIAC) 2.5-6.25 MG per tablet      Take 2 tablets  by mouth daily.    Take 1 tablet by mouth daily.  Discontinued Medications   CHOLECALCIFEROL (VITAMIN D) 1000 UNITS TABLET    Take 2 tablets (2,000 Units total) by mouth daily.   LORAZEPAM (ATIVAN) 0.5 MG TABLET    Take 1 tablet (0.5 mg total) by mouth at bedtime.    SIGNIFICANT DIAGNOSTIC EXAMS  LABS REVIEWED:   05-13-13: wbc 9.7; hgb 12.8; hct 381; mcv 86.0;plt86.0; glucose 99; bun 30; creat 1.2;k+3.8;na++135  Review of Systems  Unable to perform ROS   Physical Exam  Constitutional:  Thin   Neck: Neck supple. No tracheal deviation present. No thyromegaly present.  Cardiovascular: Normal rate, regular rhythm and intact distal pulses.   Respiratory: Effort normal and breath sounds normal. No respiratory distress.  GI: Soft. Bowel sounds are normal. She exhibits no distension. There is no tenderness.  Musculoskeletal: She exhibits no edema.  Has generalized weakness present  Neurological: She is alert.  Skin: Skin is warm.      ASSESSMENT/ PLAN:  Will discharge her to home with family and home health for pt/ot/st/cna. Will not need dme; prescriptions have been written.   Time spent patient 40 minutes.

## 2013-06-25 ENCOUNTER — Other Ambulatory Visit: Payer: Self-pay | Admitting: Internal Medicine

## 2013-06-28 ENCOUNTER — Non-Acute Institutional Stay: Payer: Medicare Other | Admitting: Family Medicine

## 2013-06-28 DIAGNOSIS — Z593 Problems related to living in residential institution: Secondary | ICD-10-CM | POA: Insufficient documentation

## 2013-06-28 DIAGNOSIS — R1310 Dysphagia, unspecified: Secondary | ICD-10-CM

## 2013-06-28 DIAGNOSIS — H5789 Other specified disorders of eye and adnexa: Secondary | ICD-10-CM

## 2013-06-28 DIAGNOSIS — F341 Dysthymic disorder: Secondary | ICD-10-CM

## 2013-06-28 DIAGNOSIS — I10 Essential (primary) hypertension: Secondary | ICD-10-CM

## 2013-06-28 DIAGNOSIS — F329 Major depressive disorder, single episode, unspecified: Secondary | ICD-10-CM

## 2013-06-28 DIAGNOSIS — S72142A Displaced intertrochanteric fracture of left femur, initial encounter for closed fracture: Secondary | ICD-10-CM | POA: Insufficient documentation

## 2013-06-28 DIAGNOSIS — I6350 Cerebral infarction due to unspecified occlusion or stenosis of unspecified cerebral artery: Secondary | ICD-10-CM

## 2013-06-28 DIAGNOSIS — F0390 Unspecified dementia without behavioral disturbance: Secondary | ICD-10-CM

## 2013-06-28 DIAGNOSIS — I639 Cerebral infarction, unspecified: Secondary | ICD-10-CM

## 2013-06-28 NOTE — Assessment & Plan Note (Signed)
Speech to evaluate her for diet recommendations

## 2013-06-28 NOTE — Progress Notes (Signed)
[facility] Admission History and Physical   History of Present Illness: Vickie Hoffman is a 79 y.o. with h/o recent acute pontomedullary infarct with expressive aphasia, h/o depression/anxiety, dementia, dysphagia who presents to [facility] for long term care from [facility].   - upon review of records, patient was hospitalized on 05/07/13 for acute pontomedullary junction infarct. She presented at the time with right facial droop, dysarthria and dysphagia. She was started on ASA 325mg  for secondary stroke prevention. She was also started on bisoprolol/hctz for better blood pressure control. She was also treated for a urinary tract infection. She was discharged to [facility] due to fact that her husband could not provide 24hr care at home.   - on review of nursing notes from [facility], it appears that patient sustained falls from her wheelchair likely from poor safety awareness and impulsive behavior. Most recently, on 06/27/13. She complained of mild pain in left hip getting out of bed into wheelchair. Xray of the left hip showed medial left acetabulum and subcapital region of the left femoral neck of indeterminate age.  Currently, patient denies any hip or leg pain. She ambulates in a wheelchair.  More complete review of systems difficult to obtain due to patient's dementia and expressive aphasia.    Review Of Systems: Per HPI with the following additions: none Otherwise 12 point review of systems was performed and was unremarkable.  Patient Active Problem List   Diagnosis Date Noted  . Redness of right eye 05/13/2013  . UTI (urinary tract infection) 05/10/2013  . CVA (cerebral infarction) 05/08/2013  . Dysphagia 05/08/2013  . Dehydration 06/25/2012  . Fracture Of Superior Pubic Ramus 06/23/2012  . Bundle branch block 06/23/2012  . Anxiety and depression   . Sinus tachycardia 10/08/2011  . Femur fracture, right 10/03/2011  . Dementia 10/03/2011  . HTN (hypertension) 10/03/2011  . ARF  (acute renal failure) 10/03/2011  . Anemia 10/03/2011   Past Medical History: Past Medical History  Diagnosis Date  . Dementia   . Anxiety and depression    Past Surgical History: Past Surgical History  Procedure Laterality Date  . Compression hip screw  10/03/2011    Procedure: COMPRESSION HIP;  Surgeon: Kathryne Hitch;  Location: [facility] OR;  Service: Orthopedics;  Laterality: Right;  . Fracture surgery     Social History: History  Substance Use Topics  . Smoking status: Never Smoker   . Smokeless tobacco: Not on file  . Alcohol Use: No   Additional social history: none Please also refer to relevant sections of EMR.  Family History: Family History  Problem Relation Age of Onset  . CVA Mother   . CAD Mother    Allergies and Medications: Allergies  Allergen Reactions  . Statins Other (See Comments)    Muscle cramps, pains, leg weakness  . Sulfa Antibiotics Other (See Comments)    Unknown    Current Outpatient Prescriptions on File Prior to Visit  Medication Sig Dispense Refill  . acetaminophen (TYLENOL) 500 MG tablet Take 500 mg by mouth 2 (two) times daily. 1 in the morning and 1 in the evening      . aspirin 325 MG tablet Take 1 tablet (325 mg total) by mouth daily.  30 tablet  3  . bisoprolol-hydrochlorothiazide (ZIAC) 2.5-6.25 MG per tablet Take 2 tablets by mouth daily.      Marland Kitchen buPROPion (WELLBUTRIN SR) 200 MG 12 hr tablet Take 200 mg by mouth 2 (two) times daily.       Marland Kitchen  donepezil (ARICEPT) 10 MG tablet Take 10 mg by mouth at bedtime.        . multivitamin (THERAGRAN) per tablet Take 1 tablet by mouth daily.        Marland Kitchen venlafaxine (EFFEXOR-XR) 75 MG 24 hr capsule Take 75 mg by mouth daily.        . Vitamin D, Ergocalciferol, (DRISDOL) 50000 UNITS CAPS Take 50,000 Units by mouth every 7 (seven) days.       No current facility-administered medications on file prior to visit.    Objective:  BP: 124/66, HR: 76, O2: 96%, T: 97.4, RR:20 Exam: General: elderly  lady, appears younger than stated age, no acute distress Follows commands. Speech is normal in fluency but content is inappropriate. Patient unable to answer orientation questions as she answers with completely different topic.  HEENT: right pupil diamond shape and reactive, left pupil round and reactive to light, moist mucous membranes, tooth fillings present, some missing lower molars Cardiovascular: RRR, S1S2, 2/6 systolic murmur best heard at left lower midclavicular border   Respiratory: normal respiratory effort, clear to auscultation bilaterally Abdomen: soft, non tender, non distended Extremities: no edema, +2 dorsalis pedis pulse bilaterally Skin: no rashes Neuro:  II: full visual fields. right pupil diamond shape and reactive, left pupil round and reactive to light III, IV, VI: EOMI intact VII: mild right sided facial droop and inability to fully close right eyelid.  VIII, IX and X: not tested, deferred to speech for IX and X XI: normal movement of shoulders and head XII: normal tongue movement.  4+/5 strength in upper extremities bilaterally 4+/5 strength in right knee flexion and 4/5 on left, moves both legs without guarding or pain.   Labs and Imaging: CBC BMET  No results found for this basename: WBC, HGB, HCT, PLT,  in the last 168 hours No results found for this basename: NA, K, CL, CO2, BUN, CREATININE, GLUCOSE, CALCIUM,  in the last 168 hours   Assessment and Plan:  See problem list  Vickie Skinner, MD 06/28/2013, 4:45 PM PGY-3, [facility]

## 2013-06-28 NOTE — Assessment & Plan Note (Signed)
Continue effexor 75, buproprion 200 bid. Monitor lorazpam 0.5mg  qhs/prn use.

## 2013-06-28 NOTE — Assessment & Plan Note (Signed)
Normotensive on admission to [facility]. Continue bisoprolol/hctz 2.5/6.25 and monitor BP.  Last BMP recorded in records with normal K and Cr:1.0. Consider repeating BMP for baseline.

## 2013-06-28 NOTE — Assessment & Plan Note (Signed)
Continue aricept 

## 2013-06-28 NOTE — Assessment & Plan Note (Addendum)
Eye no longer red or draining. Unclear if a facial nerve palsy is causing the decreased function in lids opening and closing or if it is more of a Horner's.  - Continue eye ointment and systane drops for lubrication and protection.

## 2013-06-28 NOTE — Assessment & Plan Note (Addendum)
Reported fracture of medial left acetabulum and subcapital region of left femoral neck of indeterminate age.  - Requested official xray report to better characterize fracture - ortho referral for evaluation.  - currently doesn't appear to be in pain: continue tylenol 500mg  bid and increase pain medicine if needed.

## 2013-06-28 NOTE — Assessment & Plan Note (Addendum)
Likely cause of aphasia.  - PT/OT - continue ASA 325mg  - patient unable to tolerate statin - continue BP control

## 2013-06-29 ENCOUNTER — Other Ambulatory Visit: Payer: Self-pay | Admitting: Geriatric Medicine

## 2013-06-29 MED ORDER — LORAZEPAM 0.5 MG PO TABS: ORAL_TABLET | ORAL | Status: DC

## 2013-07-01 ENCOUNTER — Telehealth: Payer: Self-pay | Admitting: Family Medicine

## 2013-07-01 NOTE — Telephone Encounter (Signed)
Spoke with [name], Vickie Hoffman's husband. He told me that they had to transfer her from [facility] to [place] because Medicare was no longer paying for rehab at [facility] and she needed to be transferred to a long term facility.  He tells me that she had been having signs of dementia for over one year prior to her stroke in June. She needed full care from him and he since he is 79years old, he was having increased difficulty being her full time caregiver.    I paged Dr. Magnus Ivan with [facility] orthopedics who recommends no weight bearing and chair ambulation for her left hip and follow up in 4 weeks. I communicated this to patient's husband and he expressed understanding.  Vickie Hoffman, PGY-3 Family Medicine Resident

## 2013-07-06 ENCOUNTER — Non-Acute Institutional Stay: Payer: Medicare Other | Admitting: Family Medicine

## 2013-07-06 ENCOUNTER — Non-Acute Institutional Stay (INDEPENDENT_AMBULATORY_CARE_PROVIDER_SITE_OTHER): Payer: Medicare Other | Admitting: Family Medicine

## 2013-07-06 DIAGNOSIS — Z593 Problems related to living in residential institution: Secondary | ICD-10-CM

## 2013-07-06 DIAGNOSIS — S72142D Displaced intertrochanteric fracture of left femur, subsequent encounter for closed fracture with routine healing: Secondary | ICD-10-CM

## 2013-07-06 DIAGNOSIS — S72009D Fracture of unspecified part of neck of unspecified femur, subsequent encounter for closed fracture with routine healing: Secondary | ICD-10-CM

## 2013-07-06 DIAGNOSIS — F0150 Vascular dementia without behavioral disturbance: Secondary | ICD-10-CM

## 2013-07-06 NOTE — Progress Notes (Signed)
Patient ID: Vickie Hoffman, female   DOB: 1920/11/10, 79 y.o.   MRN: [id number]  Update of NH status

## 2013-07-14 NOTE — Progress Notes (Signed)
Patient ID: Vickie Hoffman, female   DOB: 1920/11/22, 79 y.o.   MRN: [id number] I have seen and examined this patient on 07/06/2013. I have discussed with Dr Gwenlyn Saran.  I agree with their findings and plans as documented in their 07/06/13 admission note.

## 2013-08-02 ENCOUNTER — Non-Acute Institutional Stay (INDEPENDENT_AMBULATORY_CARE_PROVIDER_SITE_OTHER): Payer: Medicare Other | Admitting: Family Medicine

## 2013-08-02 DIAGNOSIS — S7291XD Unspecified fracture of right femur, subsequent encounter for closed fracture with routine healing: Secondary | ICD-10-CM

## 2013-08-02 DIAGNOSIS — I10 Essential (primary) hypertension: Secondary | ICD-10-CM

## 2013-08-02 DIAGNOSIS — F0150 Vascular dementia without behavioral disturbance: Secondary | ICD-10-CM

## 2013-08-02 DIAGNOSIS — S7290XD Unspecified fracture of unspecified femur, subsequent encounter for closed fracture with routine healing: Secondary | ICD-10-CM

## 2013-08-02 DIAGNOSIS — Z593 Problems related to living in residential institution: Secondary | ICD-10-CM

## 2013-08-02 NOTE — Assessment & Plan Note (Addendum)
No pain today with palpation  Following with Ortho OP, should have visit this month Avoiding weight bearing and using wheelchair, continue with modifications per ortho's reccs.

## 2013-08-02 NOTE — Assessment & Plan Note (Signed)
Elevated today with wide pulse pressure, 175/60 Given her age, we will not be very aggressive Appears that bisoprolol-HCTZ combo is not ordered avg BP appears to be 150-160/60-70 Would recommend low dose HCTZ if she develops more persistent HTN

## 2013-08-02 NOTE — Assessment & Plan Note (Signed)
Continue aricept 

## 2013-08-02 NOTE — Progress Notes (Signed)
  Subjective:    Patient ID: Vickie Hoffman, female    DOB: [birth year], 79 y.o.   MRN: [id number]  HPI Pt seen for routine nursing home visit.   She denies any complaints but mild abd discomfort bothering her throughout the day.   She denies pain in any location, dyspnea, and chest pain.   I have reviewed her med list and it is corrected in Epic.   Review of Systems Limited by patients dementia    Objective:   Physical Exam  Gen: NAD, alert, cooperative with exam, sleeping comfortable but easily arousable HEENT: NCAT, EOMI, MMM CV: RRR, good S1/S2, no murmur Resp: CTABL, no wheezes, non-labored Abd: Soft mild tenderness to palpation diffusely, BS present, no guarding or organomegaly Ext: No edema, warm Neuro: Alert and oriented to person, not place or time, No gross deficits     Assessment & Plan:

## 2013-08-09 IMAGING — CR DG LUMBAR SPINE COMPLETE 4+V
5 series · 5 of 5 positions shown · non-contrast
Comparison: None.

CLINICAL DATA: Fall

LUMBAR SPINE - COMPLETE 4+ VIEW

[t l-spine a.p.]
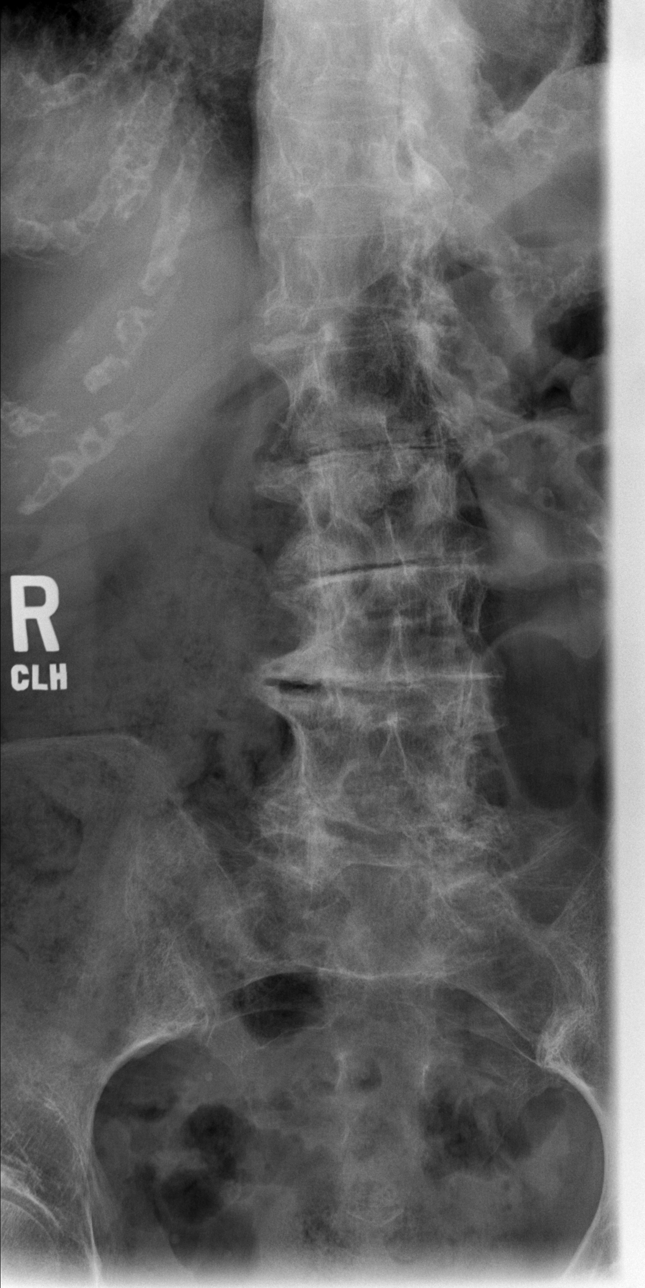

[t l-spine oblique exposure (1 of 2)]
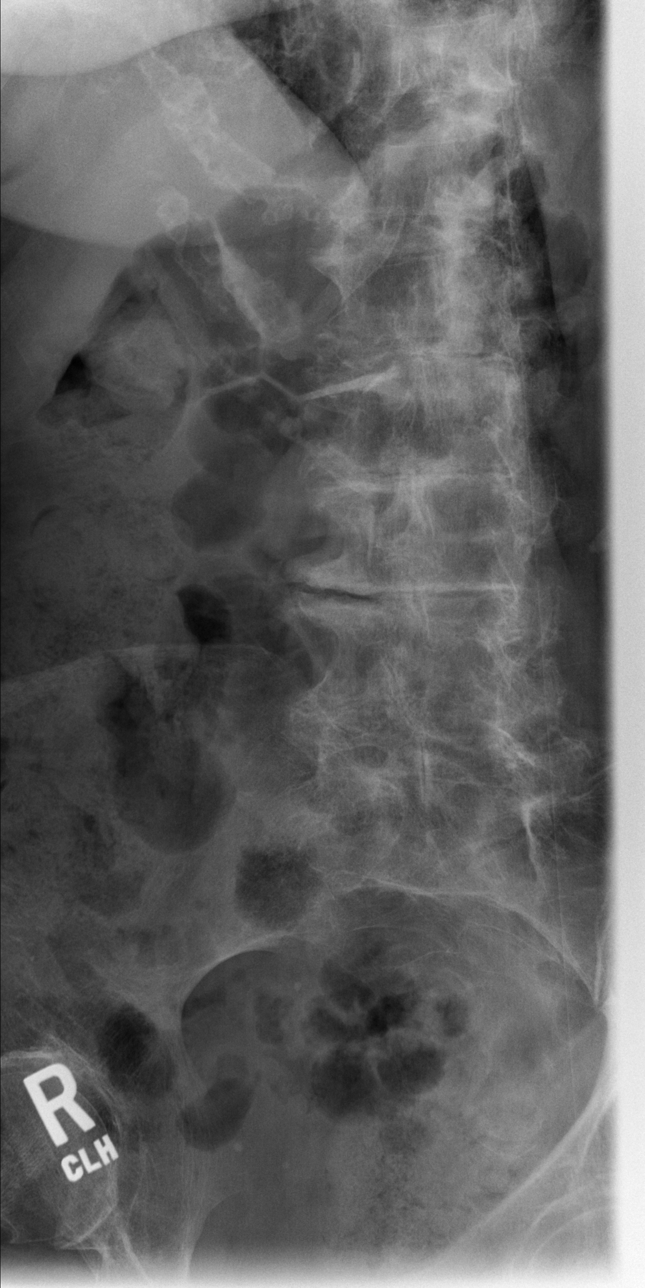

[t l-spine oblique exposure (2 of 2)]
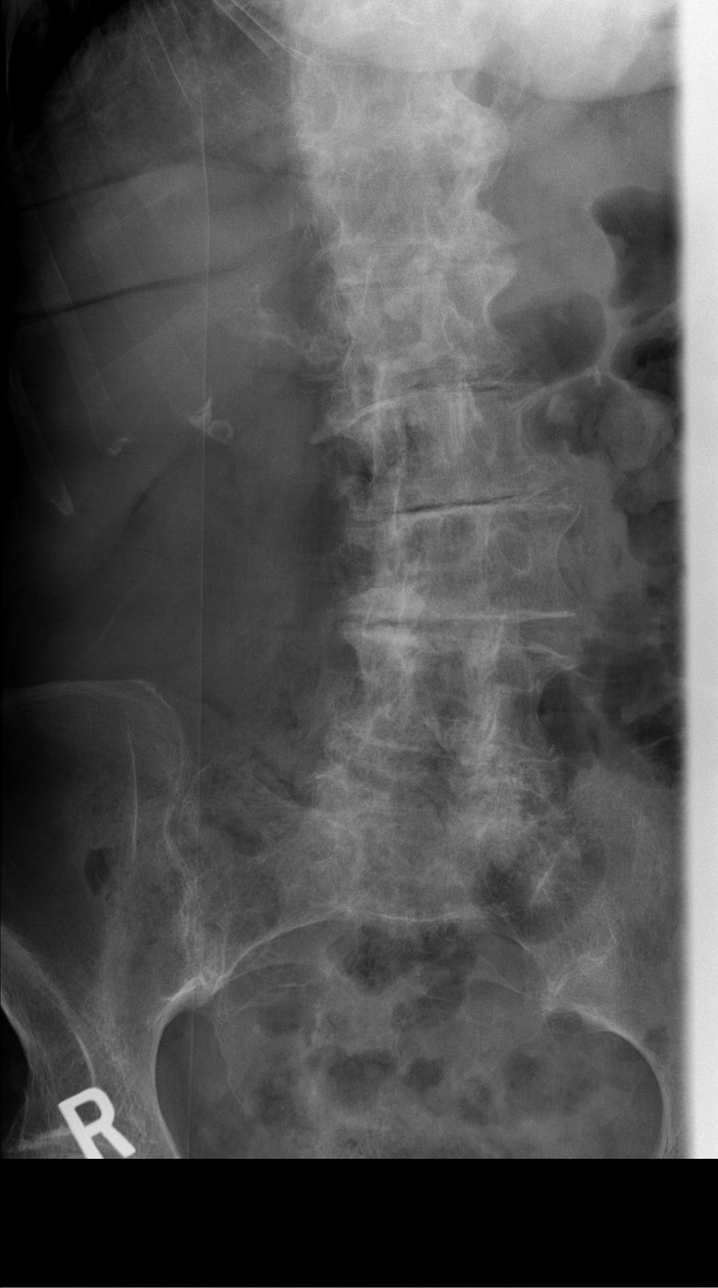

[w x table l-spine]
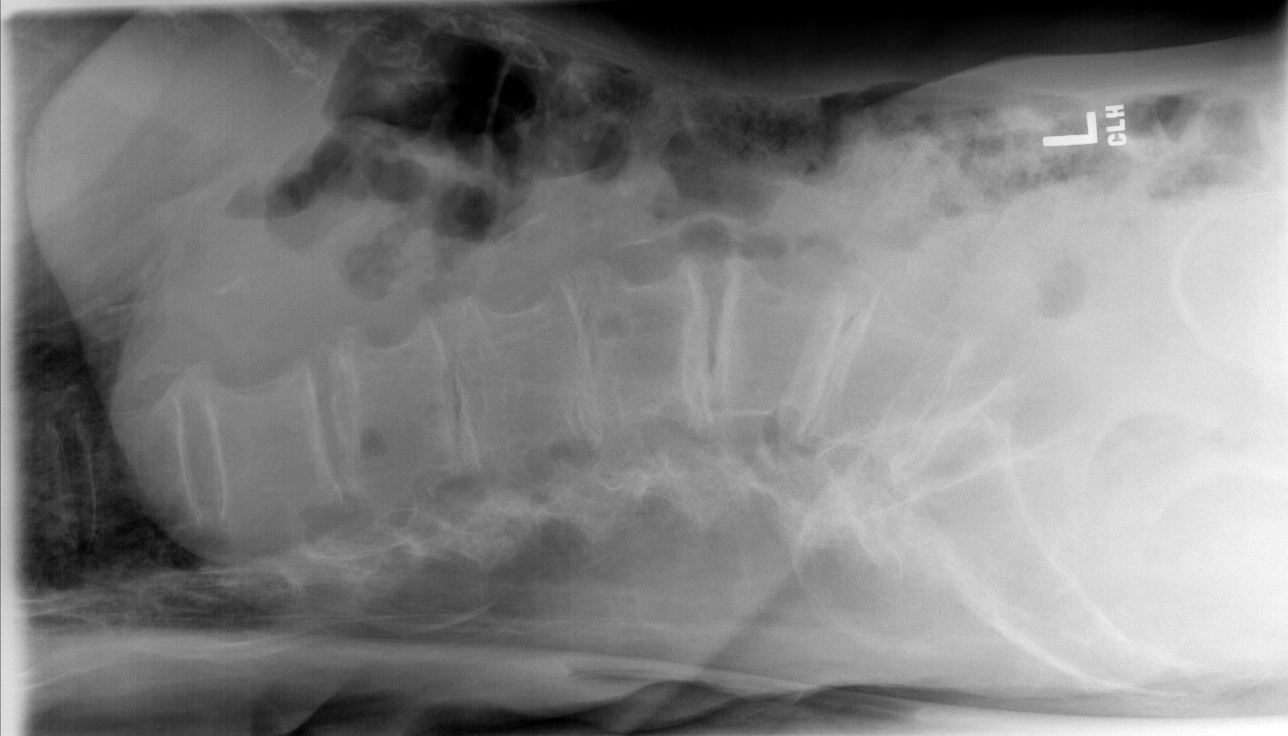

[w x table l-spine *]
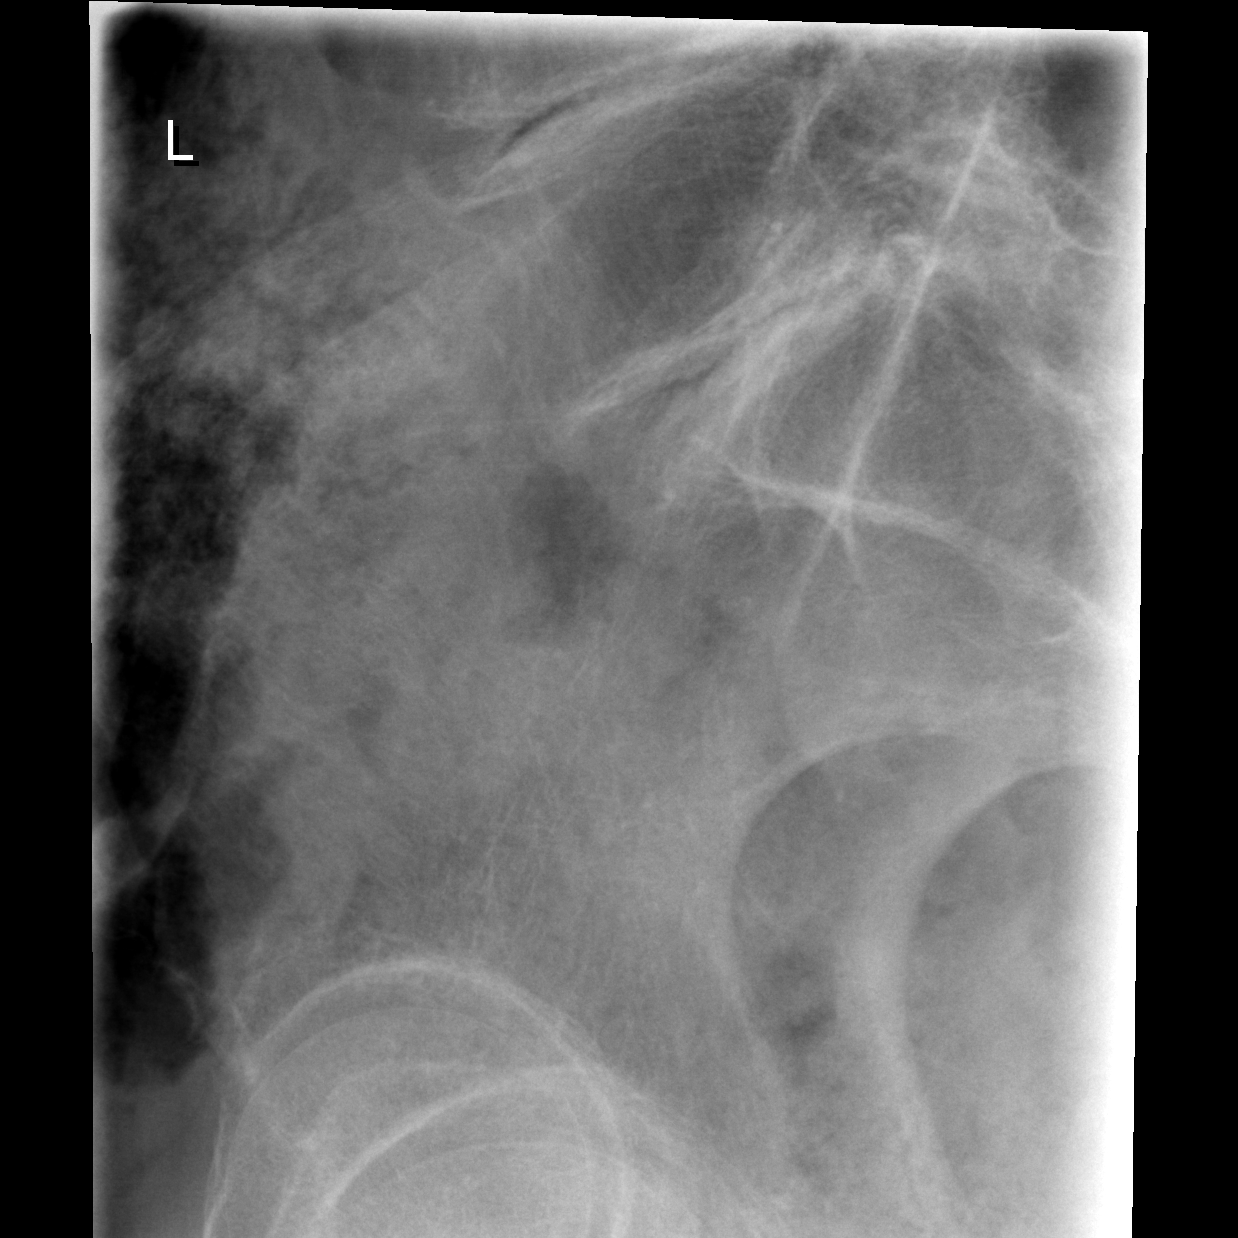

[5 of 5 positions shown; findings below may reference images not displayed]

FINDINGS: Anatomic alignment on the lateral view.  Mild
levoscoliosis with the apex at L3-4.  No vertebral body height
loss.  Severe multilevel this space narrowing and osteophyte
formation.  Osteopenia.
IMPRESSION: No acute bony pathology.  Chronic change.

## 2013-08-16 ENCOUNTER — Encounter: Payer: Self-pay | Admitting: Student-PharmD

## 2013-08-24 ENCOUNTER — Non-Acute Institutional Stay: Payer: Medicare Other | Admitting: Family Medicine

## 2013-08-24 ENCOUNTER — Other Ambulatory Visit: Payer: Self-pay | Admitting: Pharmacist

## 2013-08-24 DIAGNOSIS — S72142S Displaced intertrochanteric fracture of left femur, sequela: Secondary | ICD-10-CM

## 2013-08-24 DIAGNOSIS — I10 Essential (primary) hypertension: Secondary | ICD-10-CM

## 2013-08-24 DIAGNOSIS — S72009S Fracture of unspecified part of neck of unspecified femur, sequela: Secondary | ICD-10-CM

## 2013-08-24 MED ORDER — LORAZEPAM 0.5 MG PO TABS: 0.5000 mg | ORAL_TABLET | Freq: Every evening | ORAL | Status: DC | PRN

## 2013-08-24 NOTE — Assessment & Plan Note (Addendum)
The patient blood pressure is well-controlled off Ziac 2.5-6.252 tabs daily. Will continue to hold antihypertensive and f/u BP.

## 2013-08-24 NOTE — Progress Notes (Signed)
Attending progress note Subjective:    Patient ID: Vickie Hoffman, female    DOB: July 22, 1920, 79 y.o.   MRN: [id number]  HPI 79 year old female nursing home resident seen by attending for routine follow up.  Patient has no complaints this morning. She is evaluated in her bed. She has her eating breakfast but has not been on her today.  Regarding left hip fracture: The patient is not participating in physical therapy. She has been cleared for partial weightbearing was having too much difficulty maintaining partial weightbearing during PT to participate. She was last evaluated by her orthopedic surgeon Dr. Magnus Ivan at [facility] on 07/28/2013 who recommended follow visit one month later for repeat x-rays. The patient denies hip pain.  Patient admits to excessive fatigue. She is taking schedule Ativan 0.5 mg nightly. She denies insomnia. She denies chest pain, shortness of breath, anorexia, fever, chills, night sweats.  Review of Systems As per history of present illness.    Objective: BP 148/71  Pulse 66  Temp(Src) 97.1 F (36.2 C)  Resp 19  Wt 138 lb 3.2 oz (62.687 kg)  BMI 23.71 kg/m2 Wt Readings from Last 3 Encounters:  08/16/13 138 lb 3.2 oz (62.687 kg)  06/16/13 134 lb 12.8 oz (61.145 kg)  05/08/13 151 lb 10.8 oz (68.8 kg)      Physical Exam  Constitutional: She appears well-developed and well-nourished. No distress.  HENT:  Head: Normocephalic and atraumatic.  Cardiovascular: S1 normal, S2 normal and normal pulses.   Murmur heard.  Decrescendo systolic murmur is present with a grade of 4/6  Pulmonary/Chest: Effort normal and breath sounds normal.  Abdominal: Soft. Bowel sounds are normal.  Musculoskeletal:  Trace lower extremity edema in both feet. Left hip is slightly internally rotated. No pain with hip flexion bilaterally. Negative logroll bilaterally.  Neurological: She is alert.   08/16/13  CBG 93      Assessment & Plan:

## 2013-08-24 NOTE — Assessment & Plan Note (Signed)
The patient is not participating in physical therapy because she is currently not fully weightbearing.  Order written to set a followup appointment with Dr. Magnus Ivan for early this week. Repeat left hip x-rays to be obtained a followup appointment.

## 2013-08-31 ENCOUNTER — Other Ambulatory Visit: Payer: Self-pay | Admitting: Family Medicine

## 2013-09-07 ENCOUNTER — Other Ambulatory Visit: Payer: Self-pay | Admitting: Family Medicine

## 2013-09-07 MED ORDER — LORAZEPAM 0.5 MG PO TABS: 0.5000 mg | ORAL_TABLET | Freq: Every evening | ORAL | Status: DC | PRN

## 2013-09-12 ENCOUNTER — Encounter: Payer: Self-pay | Admitting: Pharmacist

## 2013-10-14 ENCOUNTER — Non-Acute Institutional Stay (INDEPENDENT_AMBULATORY_CARE_PROVIDER_SITE_OTHER): Payer: Medicare Other | Admitting: Family Medicine

## 2013-10-14 DIAGNOSIS — S8290XS Unspecified fracture of unspecified lower leg, sequela: Secondary | ICD-10-CM

## 2013-10-14 DIAGNOSIS — I6350 Cerebral infarction due to unspecified occlusion or stenosis of unspecified cerebral artery: Secondary | ICD-10-CM

## 2013-10-14 DIAGNOSIS — I639 Cerebral infarction, unspecified: Secondary | ICD-10-CM

## 2013-10-14 DIAGNOSIS — I10 Essential (primary) hypertension: Secondary | ICD-10-CM

## 2013-10-14 DIAGNOSIS — F0150 Vascular dementia without behavioral disturbance: Secondary | ICD-10-CM

## 2013-10-14 DIAGNOSIS — S72009S Fracture of unspecified part of neck of unspecified femur, sequela: Secondary | ICD-10-CM

## 2013-10-14 DIAGNOSIS — R001 Bradycardia, unspecified: Secondary | ICD-10-CM

## 2013-10-14 DIAGNOSIS — I495 Sick sinus syndrome: Secondary | ICD-10-CM | POA: Insufficient documentation

## 2013-10-14 DIAGNOSIS — S7291XS Unspecified fracture of right femur, sequela: Secondary | ICD-10-CM

## 2013-10-14 DIAGNOSIS — S72142S Displaced intertrochanteric fracture of left femur, sequela: Secondary | ICD-10-CM

## 2013-10-14 DIAGNOSIS — I498 Other specified cardiac arrhythmias: Secondary | ICD-10-CM

## 2013-10-14 LAB — BASIC METABOLIC PANEL: Sodium: 139 mmol/L (ref 137–147)

## 2013-10-14 NOTE — Assessment & Plan Note (Signed)
Disoreinted today, possibly 2/2 bradycardia although no other symptoms making it less likely. Continue aricept,  monitor

## 2013-10-14 NOTE — Assessment & Plan Note (Addendum)
Decreased strength on L, aphasia stable PT/OT would be helpful but patient not participating Not able to tolerate statin,  continue ASA

## 2013-10-14 NOTE — Assessment & Plan Note (Signed)
Correction- L intertrochanteric fracture, see that section.

## 2013-10-14 NOTE — Assessment & Plan Note (Signed)
At goal today  Will likely increase with stopping BB which is needed due to bradycardia Consider increasing HCTZ and amlodipine if that is maxed out without reaching goal of 150/90

## 2013-10-14 NOTE — Progress Notes (Signed)
  Subjective:    Patient ID: Vickie Hoffman, female    DOB: [birth year], 79 y.o.   MRN: [id number]  HPI  Pt seen for routine follow up in Nursing home.   She has no complaints today except for mild transient non-specific abd pain that self resolved before I saw her.   She denies current fevers, chill,s sweats, chest pain, dyspnea, and leg pain.   She is wondering how long she will be staying in the nursing home and states that her name used to be "Otilia something."  Per PT records she is still not articipating in PT assumedly because of her dementia and cognitive deficits.   Meds reviewed and accurate  Review of Systems Per HPI    Objective:   Physical Exam  Vitals:  BP 152/55, P 45, 44 on re-check, T 96.9 oral, SpO2 97%  Gen: NAD, alert, cooperative with exam, conversational HEENT: NCAT, EOMI, MMM CV: slow, 3-4/6 systolic murmur, good S1/S2 Resp: CTABL, no wheezes, non-labored Abd: SNTND, BS present, no guarding or organomegaly Ext: No edema Neuro: Alert and not oriented to place or time, only knows first name, Strength slightly decreased on LLE compared to R (4/5 R, 3-4/5 L) but senstaion intact BL     Assessment & Plan:  See problem specific assessment and plan

## 2013-10-14 NOTE — Assessment & Plan Note (Signed)
No Pain currently Not participating in PT currently possibly due to dementia associated cognitive deficits.  Dr. Magnus Ivan has signed off Will continue to monitor.

## 2013-10-14 NOTE — Assessment & Plan Note (Signed)
New after revieweing previous week's pulse, 69-85 Asymptomatic currently Ordered stat EKG and discussed with on call resident and Geriatric attendings Will hold bisoprolol, and continue same dose of HCTZ, i would be ok with increasing HCTZ right away as well however monitoring off is a reasonable course of action.  Likely will need increase in HCTZ, if maxed out without control consider amlodipine.

## 2013-10-17 ENCOUNTER — Encounter: Payer: Self-pay | Admitting: Pharmacist

## 2013-10-18 ENCOUNTER — Encounter: Payer: Self-pay | Admitting: Emergency Medicine

## 2013-10-26 ENCOUNTER — Non-Acute Institutional Stay: Payer: Medicare Other | Admitting: Family Medicine

## 2013-10-26 DIAGNOSIS — I495 Sick sinus syndrome: Secondary | ICD-10-CM

## 2013-10-26 DIAGNOSIS — F0150 Vascular dementia without behavioral disturbance: Secondary | ICD-10-CM

## 2013-10-26 DIAGNOSIS — I10 Essential (primary) hypertension: Secondary | ICD-10-CM

## 2013-10-26 NOTE — Progress Notes (Signed)
  NH Attending Progress Note Subjective:    Patient ID: Vickie Hoffman, female    DOB: 02/27/1920, 79 y.o.   MRN: [id number]  HPI 79 yo F NH patient seen for routine f/u.  Acute events: none Updated: BB d/cd due to bradycardia. No tachycardic on exam. Has history of sinus tachycardia upon review of chart. Denies CP, SOB, LE edema.    Review of Systems Negative: HA, CP, SOB, N/V/D, insomnia.     Objective:   Physical Exam BP 134/75  Pulse 104  Temp(Src) 97.6 F (36.4 C)  Resp 20  Wt 131 lb 9.6 oz (59.693 kg) Wt Readings from Last 3 Encounters:  10/26/13 131 lb 9.6 oz (59.693 kg)  08/16/13 138 lb 3.2 oz (62.687 kg)  06/16/13 134 lb 12.8 oz (61.145 kg)  General appearance: alert, cooperative and no distress oriented to person (first, last, DOB) only. Place hospital, time 1996, president Kyung Rudd. Lungs: clear to auscultation bilaterally Heart: regular rate and rhythm, S1, S2 normal, no murmur, click, rub or gallop Abdomen: soft, non-tender; bowel sounds normal; no masses,  no organomegaly Extremities: extremities normal, atraumatic, no cyanosis or edema Skin: Skin color, texture, turgor normal. No rashes or lesions     Assessment & Plan:

## 2013-10-26 NOTE — Assessment & Plan Note (Signed)
A: now tachy since d/c BB. Asymptomatic. P: Monitor Trial of CCB if needed. Low dose bisoprolol was too potent.

## 2013-10-26 NOTE — Assessment & Plan Note (Signed)
A: chronic. Improved from previous assessment. Patient calm P: monitor. Continue aricept. May benefit from adding namenda will need baseline MMSE (PCP to do at f/u).

## 2013-10-26 NOTE — Assessment & Plan Note (Signed)
A: well controlled P: continue current regimen  

## 2013-10-31 LAB — BASIC METABOLIC PANEL
BUN: 37 mg/dL — AB (ref 4–21)
Glucose: 94 mg/dL
Potassium: 4 mmol/L (ref 3.4–5.3)
Sodium: 139 mmol/L (ref 137–147)

## 2013-11-01 ENCOUNTER — Encounter: Payer: Self-pay | Admitting: Emergency Medicine

## 2013-12-06 ENCOUNTER — Non-Acute Institutional Stay (INDEPENDENT_AMBULATORY_CARE_PROVIDER_SITE_OTHER): Payer: Medicare Other | Admitting: Family Medicine

## 2013-12-06 DIAGNOSIS — S0012XA Contusion of left eyelid and periocular area, initial encounter: Secondary | ICD-10-CM

## 2013-12-06 DIAGNOSIS — S0010XA Contusion of unspecified eyelid and periocular area, initial encounter: Secondary | ICD-10-CM

## 2013-12-06 NOTE — Progress Notes (Signed)
   Subjective:    Patient ID: Vickie Hoffman, female    DOB: 1920/02/03, 80 y.o.   MRN: [id number]  HPI  Per nursing reports, pt fell on 12/04/13 from her wheelchair and struck her left eye and forehead. This results in a small laceration with bleeding that was closed by steri-strips. She also had bruising and swelling. The patient did not lose consciousness and routine neuro checks were stable, so there has been little suspicion for intracranial bleed. The patient is not on anti-coagulation, but is taking ASA 81 mg daily for anti-platelet affects. This morning the patient denies pain and vision changes and thinks "it's getting better."  Review of Systems See hpi     Objective:   Physical Exam BP 139/79  Pulse 77  Temp(Src) 98.4 F (36.9 C)  Resp 18  SpO2 99% Gen: elderly female, non ill appearing, non distressed HEENT: small laceration above left brow closed with steri-strips and without active bleeding, peri-orbital ecchymosis but no swelling, tenderness or bony deformity; PERRLA, EOMI, no hemotympanum but TM mostly obscured by cerumen bilaterally     Assessment & Plan:  Minor trauma to left forehead and peri-orbital area without concern for intra-cranial bleed or injury to the eye. Cont to monitor.

## 2013-12-09 ENCOUNTER — Other Ambulatory Visit: Payer: Self-pay | Admitting: Family Medicine

## 2013-12-09 MED ORDER — ASPIRIN EC 81 MG PO TBEC: 81.0000 mg | DELAYED_RELEASE_TABLET | Freq: Every day | ORAL | Status: AC

## 2013-12-13 ENCOUNTER — Encounter: Payer: Self-pay | Admitting: Pharmacist

## 2014-01-02 ENCOUNTER — Non-Acute Institutional Stay: Payer: Medicare Other | Admitting: Family Medicine

## 2014-01-02 DIAGNOSIS — I6350 Cerebral infarction due to unspecified occlusion or stenosis of unspecified cerebral artery: Secondary | ICD-10-CM

## 2014-01-02 DIAGNOSIS — I495 Sick sinus syndrome: Secondary | ICD-10-CM

## 2014-01-02 DIAGNOSIS — I639 Cerebral infarction, unspecified: Secondary | ICD-10-CM

## 2014-01-02 DIAGNOSIS — R1310 Dysphagia, unspecified: Secondary | ICD-10-CM

## 2014-01-02 DIAGNOSIS — I10 Essential (primary) hypertension: Secondary | ICD-10-CM

## 2014-01-02 NOTE — Progress Notes (Signed)
  [facility] Attending Progress Note  Subjective:    Patient ID: Vickie Hoffman, female    DOB: 29-May-1920, 80 y.o.   MRN: [id number]  HPI Acute events: none. Complaints: none   # HTN: compliant with HCTZ on daily. No low or high BPs. Denies HA, CP, SOB.    # tachy-brady syndrome: HR 120 during exam. Patient ambulates via wheelchair. No falls. ROS as per above.   # weight loss: admits to low appetite. Denies oral pain. GI upset. Depressed mood. Sleeps well.   # h/o CVA: no new deficits. On ASA and HCTZ for secondary prevention. No on statin or ACE - due to age, decreased life expectancy.   Medications: reviewed 01/03/14 (EPIC MAR matches HL MAR). Weekly vitamin D started 11/12/13.  Review of Systems As per HPI  Positive: occasional leg pain     Objective:   Physical Exam BP 120/64  Pulse 66  Temp(Src) 97 F (36.1 C)  Resp 18  Wt 126 lb 3.2 oz (57.244 kg) Wt Readings from Last 3 Encounters:  12/30/13 126 lb 3.2 oz (57.244 kg)  10/26/13 131 lb 9.6 oz (59.693 kg)  08/16/13 138 lb 3.2 oz (62.687 kg)  General appearance: alert, cooperative and no distress Head: Normocephalic, without obvious abnormality, atraumatic Eyes: negative findings: conjunctivae and sclerae normal, positive findings: eyelids/periorbital: blepharitis on the right Throat: white residue on tongue.  Lungs: clear to auscultation bilaterally Heart: regular rate, tachy rhythm,  S1, S2 normal and systolic murmur: early systolic 3/6, blowing at 2nd left intercostal space Extremities: extremities normal, atraumatic, no cyanosis or edema Neurologic: alert. Seemingly answers questions appropriately. Pan negative on ROS except for occasional leg pain.    Bedside swallow: patient drank water from straw. Coughed 3 times after swallowing. Clear lungs on auscultation.      Assessment & Plan:

## 2014-01-04 NOTE — Assessment & Plan Note (Addendum)
A: persistent. With associated weight loss of 10 # in the past month.  P: seen by speech 6 months ago or so. I am not sure about recommendations. Will f/u. Will ask PCP to f/u as well.  Patient may benefit from nutrition supplement.

## 2014-01-04 NOTE — Assessment & Plan Note (Signed)
A: BP wnl.  P:  Will d/c HCTZ and follow up response give wt loss and signs of mild dehydration on exam (dry mouth).

## 2014-01-04 NOTE — Assessment & Plan Note (Signed)
A: stable neuro exam P: Will d/c HCTZ in the setting of normal BP and wt loss. Will continue ASA for secondary prevention.

## 2014-01-04 NOTE — Assessment & Plan Note (Signed)
A: tachy today. Asymptomatic.  P: No treatment Avoid BB, CCB, dig due to risk of symptomatic bradycardia

## 2014-01-10 ENCOUNTER — Encounter (HOSPITAL_COMMUNITY): Payer: Self-pay | Admitting: Emergency Medicine

## 2014-01-10 ENCOUNTER — Inpatient Hospital Stay (HOSPITAL_COMMUNITY)
Admission: EM | Admit: 2014-01-10 | Discharge: 2014-01-22 | DRG: 871 | Disposition: E | Payer: Medicare Other | Attending: Family Medicine | Admitting: Family Medicine

## 2014-01-10 ENCOUNTER — Emergency Department (HOSPITAL_COMMUNITY): Payer: Medicare Other

## 2014-01-10 DIAGNOSIS — I495 Sick sinus syndrome: Secondary | ICD-10-CM

## 2014-01-10 DIAGNOSIS — Z7401 Bed confinement status: Secondary | ICD-10-CM

## 2014-01-10 DIAGNOSIS — I639 Cerebral infarction, unspecified: Secondary | ICD-10-CM

## 2014-01-10 DIAGNOSIS — E861 Hypovolemia: Secondary | ICD-10-CM | POA: Diagnosis present

## 2014-01-10 DIAGNOSIS — F419 Anxiety disorder, unspecified: Secondary | ICD-10-CM

## 2014-01-10 DIAGNOSIS — IMO0002 Reserved for concepts with insufficient information to code with codable children: Secondary | ICD-10-CM

## 2014-01-10 DIAGNOSIS — Z79899 Other long term (current) drug therapy: Secondary | ICD-10-CM

## 2014-01-10 DIAGNOSIS — R0600 Dyspnea, unspecified: Secondary | ICD-10-CM

## 2014-01-10 DIAGNOSIS — R4020 Unspecified coma: Secondary | ICD-10-CM | POA: Diagnosis present

## 2014-01-10 DIAGNOSIS — N289 Disorder of kidney and ureter, unspecified: Secondary | ICD-10-CM

## 2014-01-10 DIAGNOSIS — R6520 Severe sepsis without septic shock: Secondary | ICD-10-CM

## 2014-01-10 DIAGNOSIS — N390 Urinary tract infection, site not specified: Secondary | ICD-10-CM

## 2014-01-10 DIAGNOSIS — F32A Depression, unspecified: Secondary | ICD-10-CM

## 2014-01-10 DIAGNOSIS — G9340 Encephalopathy, unspecified: Secondary | ICD-10-CM

## 2014-01-10 DIAGNOSIS — F0150 Vascular dementia without behavioral disturbance: Secondary | ICD-10-CM

## 2014-01-10 DIAGNOSIS — E860 Dehydration: Secondary | ICD-10-CM

## 2014-01-10 DIAGNOSIS — Z8673 Personal history of transient ischemic attack (TIA), and cerebral infarction without residual deficits: Secondary | ICD-10-CM

## 2014-01-10 DIAGNOSIS — Z66 Do not resuscitate: Secondary | ICD-10-CM

## 2014-01-10 DIAGNOSIS — I10 Essential (primary) hypertension: Secondary | ICD-10-CM | POA: Diagnosis present

## 2014-01-10 DIAGNOSIS — F0390 Unspecified dementia without behavioral disturbance: Secondary | ICD-10-CM | POA: Diagnosis present

## 2014-01-10 DIAGNOSIS — F3289 Other specified depressive episodes: Secondary | ICD-10-CM | POA: Diagnosis present

## 2014-01-10 DIAGNOSIS — J449 Chronic obstructive pulmonary disease, unspecified: Secondary | ICD-10-CM | POA: Diagnosis present

## 2014-01-10 DIAGNOSIS — N179 Acute kidney failure, unspecified: Secondary | ICD-10-CM

## 2014-01-10 DIAGNOSIS — F411 Generalized anxiety disorder: Secondary | ICD-10-CM | POA: Diagnosis present

## 2014-01-10 DIAGNOSIS — R1310 Dysphagia, unspecified: Secondary | ICD-10-CM

## 2014-01-10 DIAGNOSIS — F329 Major depressive disorder, single episode, unspecified: Secondary | ICD-10-CM | POA: Diagnosis present

## 2014-01-10 DIAGNOSIS — Z515 Encounter for palliative care: Secondary | ICD-10-CM

## 2014-01-10 DIAGNOSIS — E871 Hypo-osmolality and hyponatremia: Secondary | ICD-10-CM | POA: Diagnosis not present

## 2014-01-10 DIAGNOSIS — I672 Cerebral atherosclerosis: Secondary | ICD-10-CM | POA: Diagnosis present

## 2014-01-10 DIAGNOSIS — J4489 Other specified chronic obstructive pulmonary disease: Secondary | ICD-10-CM | POA: Diagnosis present

## 2014-01-10 DIAGNOSIS — J9600 Acute respiratory failure, unspecified whether with hypoxia or hypercapnia: Secondary | ICD-10-CM | POA: Diagnosis present

## 2014-01-10 DIAGNOSIS — R64 Cachexia: Secondary | ICD-10-CM | POA: Diagnosis present

## 2014-01-10 DIAGNOSIS — D72829 Elevated white blood cell count, unspecified: Secondary | ICD-10-CM | POA: Diagnosis present

## 2014-01-10 DIAGNOSIS — A419 Sepsis, unspecified organism: Principal | ICD-10-CM | POA: Diagnosis present

## 2014-01-10 DIAGNOSIS — J189 Pneumonia, unspecified organism: Secondary | ICD-10-CM

## 2014-01-10 DIAGNOSIS — E870 Hyperosmolality and hypernatremia: Secondary | ICD-10-CM

## 2014-01-10 DIAGNOSIS — J690 Pneumonitis due to inhalation of food and vomit: Secondary | ICD-10-CM

## 2014-01-10 DIAGNOSIS — Z7982 Long term (current) use of aspirin: Secondary | ICD-10-CM

## 2014-01-10 LAB — CBC WITH DIFFERENTIAL/PLATELET
BASOS PCT: 0 % (ref 0–1)
Basophils Absolute: 0.1 10*3/uL (ref 0.0–0.1)
Eosinophils Absolute: 0 10*3/uL (ref 0.0–0.7)
Eosinophils Relative: 0 % (ref 0–5)
HCT: 48.2 % — ABNORMAL HIGH (ref 36.0–46.0)
Hemoglobin: 14.9 g/dL (ref 12.0–15.0)
Lymphocytes Relative: 11 % — ABNORMAL LOW (ref 12–46)
Lymphs Abs: 2.1 10*3/uL (ref 0.7–4.0)
MCH: 29.7 pg (ref 26.0–34.0)
MCHC: 30.9 g/dL (ref 30.0–36.0)
MCV: 96.2 fL (ref 78.0–100.0)
Monocytes Absolute: 1.3 10*3/uL — ABNORMAL HIGH (ref 0.1–1.0)
Monocytes Relative: 7 % (ref 3–12)
NEUTROS PCT: 82 % — AB (ref 43–77)
Neutro Abs: 15.5 10*3/uL — ABNORMAL HIGH (ref 1.7–7.7)
PLATELETS: 415 10*3/uL — AB (ref 150–400)
RBC: 5.01 MIL/uL (ref 3.87–5.11)
RDW: 16.3 % — ABNORMAL HIGH (ref 11.5–15.5)
WBC: 18.9 10*3/uL — ABNORMAL HIGH (ref 4.0–10.5)

## 2014-01-10 LAB — COMPREHENSIVE METABOLIC PANEL
ALBUMIN: 3.4 g/dL — AB (ref 3.5–5.2)
ALK PHOS: 255 U/L — AB (ref 39–117)
ALT: 19 U/L (ref 0–35)
AST: 19 U/L (ref 0–37)
BUN: 72 mg/dL — AB (ref 6–23)
CO2: 23 mEq/L (ref 19–32)
Calcium: 10.8 mg/dL — ABNORMAL HIGH (ref 8.4–10.5)
Chloride: 122 mEq/L — ABNORMAL HIGH (ref 96–112)
Creatinine, Ser: 2.42 mg/dL — ABNORMAL HIGH (ref 0.50–1.10)
GFR calc Af Amer: 19 mL/min — ABNORMAL LOW (ref >90)
GFR calc non Af Amer: 16 mL/min — ABNORMAL LOW (ref >90)
Glucose, Bld: 110 mg/dL — ABNORMAL HIGH (ref 70–99)
POTASSIUM: 5 meq/L (ref 3.7–5.3)
SODIUM: 168 meq/L — AB (ref 137–147)
TOTAL PROTEIN: 8 g/dL (ref 6.0–8.3)
Total Bilirubin: 0.5 mg/dL (ref 0.3–1.2)

## 2014-01-10 LAB — BASIC METABOLIC PANEL
BUN: 62 mg/dL — AB (ref 6–23)
CHLORIDE: 126 meq/L — AB (ref 96–112)
CO2: 23 meq/L (ref 19–32)
CREATININE: 2.04 mg/dL — AB (ref 0.50–1.10)
Calcium: 9 mg/dL (ref 8.4–10.5)
GFR calc non Af Amer: 20 mL/min — ABNORMAL LOW (ref >90)
GFR, EST AFRICAN AMERICAN: 23 mL/min — AB (ref >90)
Glucose, Bld: 113 mg/dL — ABNORMAL HIGH (ref 70–99)
POTASSIUM: 4.2 meq/L (ref 3.7–5.3)
Sodium: 164 mEq/L (ref 137–147)

## 2014-01-10 LAB — POCT I-STAT, CHEM 8
BUN: 64 mg/dL — AB (ref 6–23)
CREATININE: 2.7 mg/dL — AB (ref 0.50–1.10)
Calcium, Ion: 1.27 mmol/L (ref 1.13–1.30)
Chloride: 121 mEq/L — ABNORMAL HIGH (ref 96–112)
Glucose, Bld: 109 mg/dL — ABNORMAL HIGH (ref 70–99)
HCT: 48 % — ABNORMAL HIGH (ref 36.0–46.0)
Hemoglobin: 16.3 g/dL — ABNORMAL HIGH (ref 12.0–15.0)
Potassium: 4.6 mEq/L (ref 3.7–5.3)
Sodium: 166 mEq/L (ref 137–147)
TCO2: 26 mmol/L (ref 0–100)

## 2014-01-10 LAB — URINALYSIS, ROUTINE W REFLEX MICROSCOPIC
GLUCOSE, UA: NEGATIVE mg/dL
Ketones, ur: 15 mg/dL — AB
Nitrite: POSITIVE — AB
PH: 5 (ref 5.0–8.0)
Protein, ur: 30 mg/dL — AB
Specific Gravity, Urine: 1.023 (ref 1.005–1.030)
Urobilinogen, UA: 1 mg/dL (ref 0.0–1.0)

## 2014-01-10 LAB — POCT I-STAT TROPONIN I: Troponin i, poc: 0.14 ng/mL (ref 0.00–0.08)

## 2014-01-10 LAB — URINE MICROSCOPIC-ADD ON

## 2014-01-10 LAB — TROPONIN I
Troponin I: <0.3 ng/mL (ref <0.30)
Troponin I: <0.3 ng/mL (ref <0.30)

## 2014-01-10 LAB — CG4 I-STAT (LACTIC ACID): LACTIC ACID, VENOUS: 2.25 mmol/L — AB (ref 0.5–2.2)

## 2014-01-10 LAB — MRSA PCR SCREENING: MRSA BY PCR: NEGATIVE

## 2014-01-10 MED ORDER — VANCOMYCIN HCL IN DEXTROSE 1-5 GM/200ML-% IV SOLN
1000.0000 mg | Freq: Once | INTRAVENOUS | Status: AC
Start: 2014-01-10 — End: 2014-01-10
  Administered 2014-01-10: 1000 mg via INTRAVENOUS
  Filled 2014-01-10: qty 200

## 2014-01-10 MED ORDER — PIPERACILLIN-TAZOBACTAM IN DEX 2-0.25 GM/50ML IV SOLN
2.2500 g | Freq: Three times a day (TID) | INTRAVENOUS | Status: DC
  Filled 2014-01-10: qty 50

## 2014-01-10 MED ORDER — DEXTROSE 5 % IV SOLN
1.0000 g | INTRAVENOUS | Status: DC
  Administered 2014-01-10 – 2014-01-11 (×2): 1 g via INTRAVENOUS
  Filled 2014-01-10 (×2): qty 1

## 2014-01-10 MED ORDER — SODIUM CHLORIDE 0.9 % IV SOLN
1000.0000 mL | INTRAVENOUS | Status: DC
  Administered 2014-01-10: 1000 mL via INTRAVENOUS

## 2014-01-10 MED ORDER — VANCOMYCIN HCL IN DEXTROSE 750-5 MG/150ML-% IV SOLN
750.0000 mg | INTRAVENOUS | Status: DC
  Administered 2014-01-12: 750 mg via INTRAVENOUS
  Filled 2014-01-10: qty 150

## 2014-01-10 MED ORDER — HEPARIN SODIUM (PORCINE) 5000 UNIT/ML IJ SOLN
5000.0000 [IU] | Freq: Three times a day (TID) | INTRAMUSCULAR | Status: DC
  Administered 2014-01-10 – 2014-01-14 (×12): 5000 [IU] via SUBCUTANEOUS
  Filled 2014-01-10 (×17): qty 1

## 2014-01-10 MED ORDER — SODIUM CHLORIDE 0.45 % IV SOLN
INTRAVENOUS | Status: DC
  Administered 2014-01-10 (×2): via INTRAVENOUS

## 2014-01-10 MED ORDER — PIPERACILLIN-TAZOBACTAM 3.375 G IVPB 30 MIN
3.3750 g | Freq: Once | INTRAVENOUS | Status: AC
  Administered 2014-01-10: 3.375 g via INTRAVENOUS
  Filled 2014-01-10: qty 50

## 2014-01-10 MED ORDER — SODIUM CHLORIDE 0.9 % IV SOLN
1000.0000 mL | Freq: Once | INTRAVENOUS | Status: AC
  Administered 2014-01-10: 1000 mL via INTRAVENOUS

## 2014-01-10 NOTE — Progress Notes (Signed)
CRITICAL VALUE ALERT  Critical value received:  Sodium 164  Date of notification:  01/02/2014  Time of notification:  2100  Critical value read back:yes  Nurse who received alert:  Gates RiggM. Carlen Rebuck, RN  MD notified (1st page):  Teaching services paged  Time of first page:  2120  MD notified (2nd page):  Time of second page:  Responding MD:  Dr. Aviva SignsPiloto  Time MD responded:  2122  No new orders at this time. Will continue to monitor.

## 2014-01-10 NOTE — ED Notes (Signed)
Pt clean and dry. No skin breakdown noted to buttocks. Pt family provided and update as of bed status.

## 2014-01-10 NOTE — Consult Note (Signed)
PULMONARY / CRITICAL CARE MEDICINE  Name: Vickie Hoffman MRN: [id number] DOB: [birth year]    ADMISSION DATE:  01-30-14 CONSULTATION DATE:  2014/01/30  REFERRING MD :  EDP PRIMARY SERVICE:  FPTS  CHIEF COMPLAINT:  Acute encephalopathy  BRIEF PATIENT DESCRIPTION: 80 yo nursing home resident with past medical history of dementia, hiatal hernia and COPD brought to ED after being found unarousable this morning ( baseline alert and talking ).  In ED tachycardic, tachypneic, febrile, with CXR demonstrating left base airspace disease / effusion, leukocytosis, elevated creatinine and hypernatremia.  SIGNIFICANT EVENTS / STUDIES:   LINES / TUBES:  CULTURES: 2/17 Blood >>> 2/17 Urine >>>  ANTIBIOTICS: Zosyn 2/17 >>> Vancomycin 2/17 >>>  The patient is encephalopathic and unable to provide history, which was obtained for available medical records.  HISTORY OF PRESENT ILLNESS:  80 yo nursing home resident with past medical history of dementia, hiatal hernia and  COPD brought to ED after being found unarousable this morning ( baseline alert and talking ).  In ED tachycardic, tachypneic, febrile, with CXR demonstrating left base airspace disease / effusion, leukocytosis, elevated creatinine and hypernatremia.  PAST MEDICAL HISTORY :  Past Medical History  Diagnosis Date  . Dementia   . Anxiety and depression    Past Surgical History  Procedure Laterality Date  . Compression hip screw  10/03/2011    Procedure: COMPRESSION HIP;  Surgeon: Kathryne Hitch;  Location: [facility] OR;  Service: Orthopedics;  Laterality: Right;  . Fracture surgery     Prior to Admission medications   Medication Sig Start Date End Date Taking? Authorizing Provider  acetaminophen (TYLENOL) 650 MG CR tablet Take 650 mg by mouth 3 (three) times daily.    Yes Historical Provider, MD  acetaminophen (TYLENOL) 650 MG CR tablet Take 650 mg by mouth every 6 (six) hours as needed for pain.   Yes Historical Provider, MD   Artificial Tear Ointment (ARTIFICIAL TEARS) ointment Place 1 application into the right eye 4 (four) times daily.   Yes Historical Provider, MD  aspirin EC 81 MG tablet Take 1 tablet (81 mg total) by mouth daily. 12/09/13  Yes Garnetta Buddy, MD  bisacodyl (DULCOLAX) 10 MG suppository Place 10 mg rectally daily as needed for mild constipation or moderate constipation.   Yes Historical Provider, MD  buPROPion (WELLBUTRIN) 100 MG tablet Take 200 mg by mouth 2 (two) times daily.   Yes Historical Provider, MD  donepezil (ARICEPT) 10 MG tablet Take 10 mg by mouth at bedtime.     Yes Historical Provider, MD  magnesium hydroxide (MILK OF MAGNESIA) 800 MG/5ML suspension Take 30 mLs by mouth daily as needed for constipation.   Yes Historical Provider, MD  multivitamin [facility]) per tablet Take 1 tablet by mouth daily.     Yes Historical Provider, MD  Polyethyl Glycol-Propyl Glycol (SYSTANE) 0.4-0.3 % SOLN Apply 1 drop to eye every 2 (two) hours while awake.   Yes Historical Provider, MD  Sodium Phosphates (RA SALINE ENEMA) 19-7 GM/118ML ENEM Place 1 each rectally daily as needed (for constipation).   Yes Historical Provider, MD  venlafaxine (EFFEXOR) 75 MG tablet Take 75 mg by mouth daily.   Yes Historical Provider, MD  Vitamin D, Ergocalciferol, (DRISDOL) 50000 UNITS CAPS capsule Take 50,000 Units by mouth every 7 (seven) days.   Yes Historical Provider, MD   Allergies  Allergen Reactions  . Statins Other (See Comments)    Muscle cramps, pains, leg weakness  . Sulfa Antibiotics Other (  See Comments)    Unknown     FAMILY HISTORY:  Family History  Problem Relation Age of Onset  . CVA Mother   . CAD Mother    SOCIAL HISTORY:  reports that she has never smoked. She does not have any smokeless tobacco history on file. She reports that she does not drink alcohol or use illicit drugs.  REVIEW OF SYSTEMS:  Unable to provide.  INTERVAL HISTORY:  VITAL SIGNS: Temp:  [100.2 F (37.9 C)]  100.2 F (37.9 C) (02/17 1005) Pulse Rate:  [113-127] 113 (02/17 1030) Resp:  [17-23] 17 (02/17 1030) BP: (146-164)/(66-75) 146/66 mmHg (02/17 1030) SpO2:  [98 %-99 %] 99 % (02/17 1030)  HEMODYNAMICS:   VENTILATOR SETTINGS:   INTAKE / OUTPUT: Intake/Output   None    PHYSICAL EXAMINATION: General:  Appears acutely ill, cachectic Neuro:  Encephalopathic, nonfocal, cough / gag diminished HEENT:  PERRL, dry membranes Cardiovascular:  tachycardic regular  Lungs:  Bilateral coarse rhonchi throughout Abdomen:  Soft, nontender, bowel sounds diminished Musculoskeletal:  Moves all extremities, no edema Skin:  Intact  LABS: CBC  Recent Labs Lab 01/18/2014 1000 01/21/2014 1023  WBC 18.9*  --   HGB 14.9 16.3*  HCT 48.2* 48.0*  PLT 415*  --    Coag's No results found for this basename: APTT, INR,  in the last 168 hours BMET  Recent Labs Lab 01/15/2014 1023  NA 166*  K 4.6  CL 121*  BUN 64*  CREATININE 2.70*  GLUCOSE 109*   Electrolytes No results found for this basename: CALCIUM, MG, PHOS,  in the last 168 hours  Sepsis Markers  Recent Labs Lab 01/04/2014 1030  LATICACIDVEN 2.25*   ABG No results found for this basename: PHART, PCO2ART, PO2ART,  in the last 168 hours Liver Enzymes No results found for this basename: AST, ALT, ALKPHOS, BILITOT, ALBUMIN,  in the last 168 hours Cardiac Enzymes No results found for this basename: TROPONINI, PROBNP,  in the last 168 hours Glucose No results found for this basename: GLUCAP,  in the last 168 hours  Imaging: Dg Chest Port 1 View  01/13/2014   CLINICAL DATA:  Altered mental status.  EXAM: PORTABLE CHEST - 1 VIEW  COMPARISON:  05/08/2013.  Chest CTA dated 10/08/2011.  FINDINGS: The cardiac silhouette remains mildly enlarged and appears smaller without the previous rotation to the right. An oval calcific density overlying the upper thoracic spine corresponds to a nonspecific T6 vertebral sclerotic lesion seen on 10/08/2011.  This is grossly unchanged and may represent a bone island. The previously demonstrated hiatal hernia is not as well visualized today with interval increased patchy density at the left lung base. The right lung is clear. The lungs remain mildly hyperexpanded with mild diffuse peribronchial thickening and accentuation of the interstitial markings. Diffuse osteopenia. Left shoulder degenerative changes.  IMPRESSION: 1. Interval left basilar atelectasis or pneumonia and small left pleural effusion. 2. The previously seen hiatal hernia is not as well visualized today. 3. Stable cardiomegaly and changes of COPD and chronic bronchitis.   Electronically Signed   By: Gordan Payment M.D.   On: 12/31/2013 10:27   ASSESSMENT / PLAN:  Aspiration pneumonia in setting of hiatal hernia COPD without evidence of exacerbation SIRS / sepsis Severe dehydration Hypernatremia Acute renal failure / AKI Acute encephalopathy Hemoconcentration Dementia  -->  Discussed condition / goals of care over the phone with medical POA Shari Heritage Vandy Fong [phone] ): medical management, DNI/DNR.  Confirmed by ED RN. -->  Under the circumstances appears to be appropriate for SDU admission -->  Blood / urine cultures -->  Vancomycin / Zosyn -->  NPO / aspiration precautions -->  Supplemental oxygen for goal SpO2>92 -->  Bronchodilators PRN -->  May need nasotracheal suction PRN -->  BiPAP / NIMV contraindicated -->  NS 2-3 L, then @ 100 and follow BMP -->  PCCM will sign off  I have personally obtained history, examined patient, evaluated and interpreted laboratory and imaging results, reviewed medical records, formulated assessment / plan and placed orders.  CRITICAL CARE:  The patient is critically ill with multiple organ systems failure and requires high complexity decision making for assessment and support, frequent evaluation and titration of therapies, application of advanced monitoring technologies and extensive  interpretation of multiple databases. Critical Care Time devoted to patient care services described in this note is 35 minutes.   Lonia FarberZUBELEVITSKIY, Imara Standiford, MD Pulmonary and Critical Care Medicine [facility] Pager: [phone]  01/05/2014, 10:50 AM

## 2014-01-10 NOTE — Care Management Note (Signed)
    Page 1 of 1   07-11-2014     2:58:09 PM   CARE MANAGEMENT NOTE 07-11-2014  Patient:  Vickie Hoffman,Vickie Hoffman   Account Number:  [id number]  Date Initiated:  008-18-2015  Documentation initiated by:  Avie ArenasBROWN,Anzley Dibbern  Subjective/Objective Assessment:   Found unresponsvie at SNF - Septic.  Full DNR.     Action/Plan:   Anticipated DC Date:  01/13/2014   Anticipated DC Plan:  SKILLED NURSING FACILITY  In-house referral  Clinical Social Worker      DC Planning Services  CM consult      Choice offered to / List presented to:             Status of service:  In process, will continue to follow Medicare Important Message given?   (If response is "NO", the following Medicare IM given date fields will be blank) Date Medicare IM given:   Date Additional Medicare IM given:    Discharge Disposition:    Per UR Regulation:  Reviewed for med. necessity/level of care/duration of stay  If discussed at Long Length of Stay Meetings, dates discussed:    Comments:  ContactThompson Caul:  Hoffman,Vickie Spouse [phone]                 Vickie HodgkinsShelton,Vickie Son [phone]  10-12-2014 3pm Avie ArenasSarah Alicya Bena, RNBSN - [phone] [facility] - SW referral placed.

## 2014-01-10 NOTE — ED Notes (Signed)
Results of lactic acid and troponin shown to Dr. Dorris CarnesN. Rubin PayorPickering

## 2014-01-10 NOTE — ED Notes (Signed)
Admitting MD at bedside.

## 2014-01-10 NOTE — H&P (Signed)
Family Medicine Teaching [facility] Admission History and Physical Service Pager: [phone]  Patient name: Vickie Hoffman Medical record number: [id number] Date of birth: June 08, 1920 Age: 80 y.o. Gender: female  Primary Care Provider: Kevin Fenton, MD Consultants: CCM (called to admit, consulted and signed off) Code Status: DNR/DNI  Chief Complaint: Unresponsive  Assessment and Plan: Vickie Hoffman is a 80 y.o. female presenting from [facility] found to be unresponsive (baseline alert / talking), initially in ED was tachycardic, tachypneic, febrile, found to have LLL HCAP / UTI. Clinically consistent with sepsis. PMH is significant for dementia (wt loss with dec PO), anxiety/depression, hiatal hernia, COPD, HTN, h/o CVA, h/o tachy-brady syndrome.  # Severe Sepsis / HCAP / UTI, without hypotension - with altered mental status Meets criteria SIRS with leukocytosis (18.9), tachycardia (119), borderline tachypnea and borderline fever; suspected source LLL pneumonia on CXR (considered HCAP, nursing home resident) vs UTI (UA with large leuks, TNTC WBC, pos nitrite). Labs significant for WBC 18.9, inc neutrophil (82%). Evidence of tissue hypoperfusion / end-organ damage with elevated LA 2.25, Cr 2.42, Trop 0.14. Currently no O2 requirement. - admit to SDU, attending Dr. Armen Pickup - remains with altered mental status / decreased responsiveness - vancomycin and zosyn started in ED, will change zosyn to cefepime for HCAP coverage - BCx x 2, Urine cx pending - consult to palliative care, discussed with son Gilford Rile) agreeable, and expresses patient's previous wishes of DNR/DNI  # Hypernatremia, likely secondary to dehydration sodium on admission 168, chloride 122. Chart review shows this is a new finding likely due to severe dehydration in the setting of AMS (also note that WBC/RBC/Plts are all elevated above baseline, consistent with dehydration). Free water deficit = 5.13 L. Received  1 l in ED of NS - calculated rate to reduce Na by 0.63mEq/hr = 143cc/hr of 1/2 NS (slow correction) - repeat bmet at 18:00 today, adjust IVF accordingly  # AKI, likely secondary to dehydration with hypoperfusion Likely 2/2 to hypoperfusion mentioned above - Cr on admission 2.42, baseline appears 1-1.1 range - trend daily BMET - hydrate per above  # Troponin elevation, likely secondary to demand ischemia in setting of sepsis Elevated i-STAT troponin 0.14. EKG with Sinus tachycardia, Probable left atrial enlargement, LVH with secondary repolarization abnormality, Inferior infarct (old). - will cycle troponins x 3 - repeat EKG in AM  # Acute Encephalopathy with decreased responsiveness, in setting of chronic dementia Acute decline in mental status with decreased responsiveness likely secondary to sepsis / hypovolemia / dehydration, hypernatremia. Expect improvement on treatment of infectious / metabolic insults. Pt inc risk for AMS changes given h/o dementia - continue to monitor - when mental status improve will consider restart PO Aricept.   # Depression  - will hold for now and consider restart home effexor, wellbutrin when able to take PO.   FEN/GI: - NPO (d/t AMS) - s/p 1L bolus NS - IVF changed from 125cc/hr initially to 1/2 NS @ 143 cc/hr Prophylaxis: Heparin SQ  Disposition: admit to SDU, initiating appropriate medical therapy for reversible problems, pending clinical improvement / response to IVF, antibiotics, will determine expected prognosis / course, consulted palliative care to assist with goals of care / clarify family wishes. Expected >3 day hospitalization.  History of Present Illness:  Vickie Hoffman is a 80 y.o. female presenting from [facility] earlier today when found to be unresponsive by nursing staff. Prior to arrival to ED, reported to be febrile to 101.53F, HR 130, and  91% O2 on RA, received 500cc IVF bolus.  Currently in ED,  initially called Code Sepsis and evaluated by PCCM, confirmed patient is DNR/DNI per present family (son). In ED, received 1L NS IVF, completed work-up with likely source of infection LLL HCAP on CXR and UA consistent with UTI. CBC, CMET, Troponin, EKG other labs all collected.  Per son's report, patient had recently within the past month been at her usual baseline and has been alert and talkative, although reports increased sleepiness over past several weeks to months. Current presentation is acute change from baseline. He is unsure the last time she was seen at her baseline, other family member's may have been with patient over weekend.  Review Of Systems: Per HPI with the following additions: unable to obtain / pt unable to respond Otherwise 12 point review of systems was performed and was unremarkable.  Patient Active Problem List   Diagnosis Date Noted  . Sepsis 01-28-14  . Aspiration pneumonia Jan 28, 2014  . Acute encephalopathy 01/28/2014  . Dehydration 01/28/14  . Acute renal failure 01-28-2014  . Tachy-brady syndrome 10/14/2013  . Intertrochanteric fracture of left hip 06/28/2013  . Person living in residential institution 06/28/2013  . Redness of right eye 05/13/2013  . CVA (cerebral infarction) 05/08/2013  . Dysphagia 05/08/2013  . Fracture Of Superior Pubic Ramus 06/23/2012  . Bundle branch block 06/23/2012  . Anxiety and depression   . Femur fracture, right 10/03/2011  . Dementia, vascular, mixed 10/03/2011  . HTN (hypertension) 10/03/2011  . Anemia 10/03/2011   Past Medical History: Past Medical History  Diagnosis Date  . Dementia   . Anxiety and depression    Past Surgical History: Past Surgical History  Procedure Laterality Date  . Compression hip screw  10/03/2011    Procedure: COMPRESSION HIP;  Surgeon: Kathryne Hitch;  Location: [facility] OR;  Service: Orthopedics;  Laterality: Right;  . Fracture surgery     Social History: History  Substance Use  Topics  . Smoking status: Never Smoker   . Smokeless tobacco: Not on file  . Alcohol Use: No   Additional social history: Transferred from [facility]   Please also refer to relevant sections of EMR.  Family History: Family History  Problem Relation Age of Onset  . CVA Mother   . CAD Mother    Allergies and Medications: Allergies  Allergen Reactions  . Statins Other (See Comments)    Muscle cramps, pains, leg weakness  . Sulfa Antibiotics Other (See Comments)    Unknown    No current facility-administered medications on file prior to encounter.   Current Outpatient Prescriptions on File Prior to Encounter  Medication Sig Dispense Refill  . acetaminophen (TYLENOL) 650 MG CR tablet Take 650 mg by mouth 3 (three) times daily.       . Artificial Tear Ointment (ARTIFICIAL TEARS) ointment Place 1 application into the right eye 4 (four) times daily.      Marland Kitchen aspirin EC 81 MG tablet Take 1 tablet (81 mg total) by mouth daily.  30 tablet  5  . buPROPion (WELLBUTRIN) 100 MG tablet Take 200 mg by mouth 2 (two) times daily.      Marland Kitchen donepezil (ARICEPT) 10 MG tablet Take 10 mg by mouth at bedtime.        . multivitamin (THERAGRAN) per tablet Take 1 tablet by mouth daily.        Bertram Gala Glycol-Propyl Glycol (SYSTANE) 0.4-0.3 % SOLN Apply 1 drop to eye every 2 (two)  hours while awake.      . Vitamin D, Ergocalciferol, (DRISDOL) 50000 UNITS CAPS capsule Take 50,000 Units by mouth every 7 (seven) days.        Objective: BP 134/60  Pulse 111  Temp(Src) 98.1 F (36.7 C) (Axillary)  Resp 13  Ht 5\' 4"  (1.626 m)  Wt 127 lb 10.3 oz (57.9 kg)  BMI 21.90 kg/m2  SpO2 94% Exam: General: acute on chronically ill appearing, cachectic, incoherent responsiveness HEENT: PERRL (R-eye with irregular iris shape), unable to follow commands with EOM, dry MM Cardiovascular: Tachycardic, regular rhythm no murmurs Respiratory: Diffuse bilateral coarse breath sounds with rhonchi greater at bases,  diminished air movement, not able to participate in resp exam, no resp distress Abdomen: soft, NTND, diminished BS Extremities: b/l feet cool with skin mottling changes, +2 peripheral pulses intact Skin: no evidence of ulceration or skin breakdown Neuro: encephalopathic, some response to verbal with poor following commands but attempts, non-focal exam  Labs and Imaging: CBC BMET   Recent Labs Lab 12/28/2013 1000 01/15/2014 1023  WBC 18.9*  --   HGB 14.9 16.3*  HCT 48.2* 48.0*  PLT 415*  --     Recent Labs Lab 01/18/2014 1000 12/28/2013 1023  NA 168* 166*  K 5.0 4.6  CL 122* 121*  CO2 23  --   BUN 72* 64*  CREATININE 2.42* 2.70*  GLUCOSE 110* 109*  CALCIUM 10.8*  --      Troponin (POCT) - 0.14 Troponin-I - cycle x 3 Lactic Acid - 2.25  Blood Culture x2 (collected 2/17 - pending) Urine Culture (pending)  2/17 CXR Portable 1v IMPRESSION:  1. Interval left basilar atelectasis or pneumonia and small left  pleural effusion.  2. The previously seen hiatal hernia is not as well visualized  today.  3. Stable cardiomegaly and changes of COPD and chronic bronchitis.   Saralyn PilarAlexander Karamalegos, DO 01/21/2014, 1:58 PM PGY-1, [facility] FPTS Intern pager: [phone], text pages welcome   Teaching Service Addendum. I have seen and evaluated this pt and agree with Dr. Lynnea MaizesKaramalegos's  assessment and plan as it is documented on this note.   D. Piloto Rolene Arboure La Paz, MD Family Medicine  PGY-3

## 2014-01-10 NOTE — ED Notes (Signed)
Family medicine at bedside.

## 2014-01-10 NOTE — ED Notes (Signed)
Dr. Herma CarsonZ, critical care calling patient's son on phone who reports patient is a DNR.

## 2014-01-10 NOTE — ED Notes (Signed)
One of patient's son's arrived at bedside. Critical care reporting to cancel code sepsis. Secretary calling carelink to cancel sepsis.

## 2014-01-10 NOTE — ED Notes (Addendum)
Pt comes from [facility] this morning staff that patient was just sleepy, short time later noticed patient had altered mental status and was not responding to baseline. At baseline she is alert and talking. Pt is tachycardic 130, febrile 101.1 tympanic. Responds to pain, snoring respirations, was 91% RA. Is not usually on oxygen is 97 % 2 L, initial BP 160 systolic, recent BP 135/76, CBG 112. 20 to left wrist. Received 500 cc NS bolus.

## 2014-01-10 NOTE — ED Provider Notes (Signed)
CSN: [id number]     Arrival date & time 12/30/2013  0944 History   First MD Initiated Contact with Patient 01/02/2014 [phone]     Chief Complaint  Patient presents with  . Altered Mental Status   Level 5 caveat due to AMS  (Consider location/radiation/quality/duration/timing/severity/associated sxs/prior Treatment) Patient is a 80 y.o. female presenting with altered mental status. The history is provided by the patient, the nursing home and the EMS personnel.  Altered Mental Status  patient is altered mental status. Had reported fever at the nursing home. Some hypoxia and is not normally oxygen dependent.  Past Medical History  Diagnosis Date  . Dementia   . Anxiety and depression    Past Surgical History  Procedure Laterality Date  . Compression hip screw  10/03/2011    Procedure: COMPRESSION HIP;  Surgeon: Kathryne Hitchhristopher Y Blackman;  Location: [facility] OR;  Service: Orthopedics;  Laterality: Right;  . Fracture surgery     Family History  Problem Relation Age of Onset  . CVA Mother   . CAD Mother    History  Substance Use Topics  . Smoking status: Never Smoker   . Smokeless tobacco: Not on file  . Alcohol Use: No   OB History   Grav Para Term Preterm Abortions TAB SAB Ect Mult Living                 Review of Systems  Unable to perform ROS     Allergies  Statins and Sulfa antibiotics  Home Medications   No current outpatient prescriptions on file. BP 158/65  Pulse 111  Temp(Src) 99 F (37.2 C) (Axillary)  Resp 21  Ht 5\' 4"  (1.626 m)  Wt 127 lb 10.3 oz (57.9 kg)  BMI 21.90 kg/m2  SpO2 97% Physical Exam  Constitutional: She appears well-developed.  HENT:  Mucous membranes are very dry. Minimal gag reflex.  Eyes:  Right pupil appears postsurgical. Left pupil somewhat responsive  Neck: Neck supple.  Cardiovascular:  Tachycardia  Pulmonary/Chest:  Diffuse transmitted upper airway sounds.  Abdominal: There is no tenderness.  Musculoskeletal: She exhibits no edema.   Neurological:  Patient is mentally responsive. She did appear to squeeze right hand to command, however she would not release. She would not squeeze on the left side. Minimal gag reflex. She is breathing spontaneously. She did withdraw to pain bilaterally  Skin: Skin is warm.    ED Course  Procedures (including critical care time) Labs Review Labs Reviewed  CBC WITH DIFFERENTIAL - Abnormal; Notable for the following:    WBC 18.9 (*)    HCT 48.2 (*)    RDW 16.3 (*)    Platelets 415 (*)    Neutrophils Relative % 82 (*)    Neutro Abs 15.5 (*)    Lymphocytes Relative 11 (*)    Monocytes Absolute 1.3 (*)    All other components within normal limits  COMPREHENSIVE METABOLIC PANEL - Abnormal; Notable for the following:    Sodium 168 (*)    Chloride 122 (*)    Glucose, Bld 110 (*)    BUN 72 (*)    Creatinine, Ser 2.42 (*)    Calcium 10.8 (*)    Albumin 3.4 (*)    Alkaline Phosphatase 255 (*)    GFR calc non Af Amer 16 (*)    GFR calc Af Amer 19 (*)    All other components within normal limits  URINALYSIS, ROUTINE W REFLEX MICROSCOPIC - Abnormal; Notable for the following:  APPearance CLOUDY (*)    Hgb urine dipstick TRACE (*)    Bilirubin Urine SMALL (*)    Ketones, ur 15 (*)    Protein, ur 30 (*)    Nitrite POSITIVE (*)    Leukocytes, UA LARGE (*)    All other components within normal limits  URINE MICROSCOPIC-ADD ON - Abnormal; Notable for the following:    Bacteria, UA FEW (*)    Casts GRANULAR CAST (*)    All other components within normal limits  POCT I-STAT, CHEM 8 - Abnormal; Notable for the following:    Sodium 166 (*)    Chloride 121 (*)    BUN 64 (*)    Creatinine, Ser 2.70 (*)    Glucose, Bld 109 (*)    Hemoglobin 16.3 (*)    HCT 48.0 (*)    All other components within normal limits  POCT I-STAT TROPONIN I - Abnormal; Notable for the following:    Troponin i, poc 0.14 (*)    All other components within normal limits  CG4 I-STAT (LACTIC ACID) - Abnormal;  Notable for the following:    Lactic Acid, Venous 2.25 (*)    All other components within normal limits  MRSA PCR SCREENING  CULTURE, BLOOD (ROUTINE X 2)  CULTURE, BLOOD (ROUTINE X 2)  URINE CULTURE  TROPONIN I  BASIC METABOLIC PANEL  TROPONIN I  TROPONIN I   Imaging Review Dg Chest Port 1 View  01/12/2014   CLINICAL DATA:  Altered mental status.  EXAM: PORTABLE CHEST - 1 VIEW  COMPARISON:  05/08/2013.  Chest CTA dated 10/08/2011.  FINDINGS: The cardiac silhouette remains mildly enlarged and appears smaller without the previous rotation to the right. An oval calcific density overlying the upper thoracic spine corresponds to a nonspecific T6 vertebral sclerotic lesion seen on 10/08/2011. This is grossly unchanged and may represent a bone island. The previously demonstrated hiatal hernia is not as well visualized today with interval increased patchy density at the left lung base. The right lung is clear. The lungs remain mildly hyperexpanded with mild diffuse peribronchial thickening and accentuation of the interstitial markings. Diffuse osteopenia. Left shoulder degenerative changes.  IMPRESSION: 1. Interval left basilar atelectasis or pneumonia and small left pleural effusion. 2. The previously seen hiatal hernia is not as well visualized today. 3. Stable cardiomegaly and changes of COPD and chronic bronchitis.   Electronically Signed   By: Gordan Payment M.D.   On: 01/03/2014 10:27      MDM   Final diagnoses:  Sepsis  Renal insufficiency   Patient with altered mental status. Pneumonia on x-ray. Hypernatremia. Discussed with critical care, and to status was changed to DO NOT RESUSCITATE. Admitted to [facility] R. Rubin Payor, MD 01/05/2014 [phone]

## 2014-01-10 NOTE — Progress Notes (Addendum)
ANTIBIOTIC CONSULT NOTE - INITIAL  Pharmacy Consult for Vancomycin, Zosyn Indication: aspiration pneumonia  Allergies  Allergen Reactions  . Statins Other (See Comments)    Muscle cramps, pains, leg weakness  . Sulfa Antibiotics Other (See Comments)    Unknown     Patient Measurements: Height: 5' 4.17" (163 cm) Weight: 126 lb 1.7 oz (57.2 kg) IBW/kg (Calculated) : 55.1  Labs:  Recent Labs  01/03/2014 1000 01/13/2014 1023  WBC 18.9*  --   HGB 14.9 16.3*  PLT 415*  --   CREATININE  --  2.70*   Estimated Creatinine Clearance: 11.8 ml/min (by C-G formula based on Cr of 2.7).  Microbiology: No results found for this or any previous visit (from the past 720 hour(s)).  Medical History: Past Medical History  Diagnosis Date  . Dementia   . Anxiety and depression    Assessment: 80 year old female beginning antibiotics for presumed aspiration pneumonia. Scr = 2.70, CrCl = 12 ml/min + Troponin, febrile, tachycardic  Goal of Therapy:  Vancomycin trough level 15-20 mcg/ml Appropriate Zosyn dosing  Plan:  1) Zosyn 2.25 grams iv Q 8 hours (starting at 6 pm) 2) Vancomycin 750 mg iv Q 48 hours (starting 2/19) 3) Follow up cultures, fever trend, Scr  Thank you. Okey RegalLisa Powell, PharmD [phone]  01/09/2014,11:07 AM  Addum:  Change zosyn to maxipime, 1gm IV q24 hours

## 2014-01-11 ENCOUNTER — Non-Acute Institutional Stay (INDEPENDENT_AMBULATORY_CARE_PROVIDER_SITE_OTHER): Payer: Medicare Other | Admitting: Family Medicine

## 2014-01-11 ENCOUNTER — Inpatient Hospital Stay (HOSPITAL_COMMUNITY): Payer: Medicare Other

## 2014-01-11 DIAGNOSIS — Z593 Problems related to living in residential institution: Secondary | ICD-10-CM

## 2014-01-11 DIAGNOSIS — E870 Hyperosmolality and hypernatremia: Secondary | ICD-10-CM

## 2014-01-11 DIAGNOSIS — N289 Disorder of kidney and ureter, unspecified: Secondary | ICD-10-CM

## 2014-01-11 DIAGNOSIS — I495 Sick sinus syndrome: Secondary | ICD-10-CM

## 2014-01-11 DIAGNOSIS — I6350 Cerebral infarction due to unspecified occlusion or stenosis of unspecified cerebral artery: Secondary | ICD-10-CM

## 2014-01-11 LAB — BASIC METABOLIC PANEL
BUN: 60 mg/dL — AB (ref 6–23)
BUN: 57 mg/dL — ABNORMAL HIGH (ref 6–23)
BUN: 50 mg/dL — ABNORMAL HIGH (ref 6–23)
BUN: 47 mg/dL — AB (ref 6–23)
CHLORIDE: 124 meq/L — AB (ref 96–112)
CHLORIDE: 122 meq/L — AB (ref 96–112)
CHLORIDE: 118 meq/L — AB (ref 96–112)
CO2: 25 mEq/L (ref 19–32)
CO2: 27 meq/L (ref 19–32)
CO2: 23 meq/L (ref 19–32)
CO2: 24 mEq/L (ref 19–32)
Calcium: 9.1 mg/dL (ref 8.4–10.5)
Calcium: 9.6 mg/dL (ref 8.4–10.5)
Calcium: 9.3 mg/dL (ref 8.4–10.5)
Calcium: 9.2 mg/dL (ref 8.4–10.5)
Chloride: 118 mEq/L — ABNORMAL HIGH (ref 96–112)
Creatinine, Ser: 1.96 mg/dL — ABNORMAL HIGH (ref 0.50–1.10)
Creatinine, Ser: 1.92 mg/dL — ABNORMAL HIGH (ref 0.50–1.10)
Creatinine, Ser: 1.63 mg/dL — ABNORMAL HIGH (ref 0.50–1.10)
Creatinine, Ser: 1.6 mg/dL — ABNORMAL HIGH (ref 0.50–1.10)
GFR calc Af Amer: 25 mL/min — ABNORMAL LOW (ref >90)
GFR calc Af Amer: 31 mL/min — ABNORMAL LOW (ref >90)
GFR calc non Af Amer: 21 mL/min — ABNORMAL LOW (ref >90)
GFR calc non Af Amer: 22 mL/min — ABNORMAL LOW (ref >90)
GFR calc non Af Amer: 26 mL/min — ABNORMAL LOW (ref >90)
GFR calc non Af Amer: 27 mL/min — ABNORMAL LOW (ref >90)
GFR, EST AFRICAN AMERICAN: 25 mL/min — AB (ref >90)
GFR, EST AFRICAN AMERICAN: 31 mL/min — AB (ref >90)
GLUCOSE: 103 mg/dL — AB (ref 70–99)
Glucose, Bld: 90 mg/dL (ref 70–99)
Glucose, Bld: 131 mg/dL — ABNORMAL HIGH (ref 70–99)
Glucose, Bld: 91 mg/dL (ref 70–99)
POTASSIUM: 3.9 meq/L (ref 3.7–5.3)
POTASSIUM: 3.7 meq/L (ref 3.7–5.3)
POTASSIUM: 3.7 meq/L (ref 3.7–5.3)
Potassium: 3.9 mEq/L (ref 3.7–5.3)
SODIUM: 163 meq/L — AB (ref 137–147)
SODIUM: 156 meq/L — AB (ref 137–147)
SODIUM: 157 meq/L — AB (ref 137–147)
Sodium: 162 mEq/L — ABNORMAL HIGH (ref 137–147)

## 2014-01-11 LAB — CBC
HEMATOCRIT: 38.7 % (ref 36.0–46.0)
Hemoglobin: 11.7 g/dL — ABNORMAL LOW (ref 12.0–15.0)
MCH: 29.1 pg (ref 26.0–34.0)
MCHC: 30 g/dL (ref 30.0–36.0)
MCV: 97 fL (ref 78.0–100.0)
Platelets: 284 10*3/uL (ref 150–400)
RBC: 3.99 MIL/uL (ref 3.87–5.11)
RDW: 16.2 % — AB (ref 11.5–15.5)
WBC: 14.7 10*3/uL — AB (ref 4.0–10.5)

## 2014-01-11 LAB — TROPONIN I

## 2014-01-11 LAB — PRO B NATRIURETIC PEPTIDE: PRO B NATRI PEPTIDE: 2941 pg/mL — AB (ref 0–450)

## 2014-01-11 MED ORDER — DEXTROSE 5 % IV SOLN
INTRAVENOUS | Status: DC
  Administered 2014-01-11: 12:00:00 via INTRAVENOUS

## 2014-01-11 MED ORDER — MORPHINE SULFATE 2 MG/ML IJ SOLN
1.0000 mg | INTRAMUSCULAR | Status: DC | PRN
  Administered 2014-01-11: 1 mg via INTRAVENOUS
  Filled 2014-01-11: qty 1

## 2014-01-11 MED ORDER — PIPERACILLIN-TAZOBACTAM IN DEX 2-0.25 GM/50ML IV SOLN
2.2500 g | Freq: Three times a day (TID) | INTRAVENOUS | Status: DC
  Administered 2014-01-11 – 2014-01-13 (×5): 2.25 g via INTRAVENOUS
  Filled 2014-01-11 (×7): qty 50

## 2014-01-11 MED ORDER — HYDRALAZINE HCL 20 MG/ML IJ SOLN
10.0000 mg | INTRAMUSCULAR | Status: DC | PRN
  Filled 2014-01-11: qty 1

## 2014-01-11 MED ORDER — MORPHINE SULFATE 2 MG/ML IJ SOLN
1.0000 mg | INTRAMUSCULAR | Status: DC | PRN
  Administered 2014-01-11 – 2014-01-12 (×2): 1 mg via INTRAVENOUS
  Filled 2014-01-11 (×2): qty 1

## 2014-01-11 MED ORDER — SODIUM CHLORIDE 0.45 % IV SOLN
INTRAVENOUS | Status: DC
  Administered 2014-01-11: 17:00:00 via INTRAVENOUS
  Administered 2014-01-12: 125 mL via INTRAVENOUS
  Administered 2014-01-12 – 2014-01-15 (×3): via INTRAVENOUS

## 2014-01-11 MED ORDER — SODIUM CHLORIDE 0.9 % IV BOLUS (SEPSIS)
1000.0000 mL | Freq: Once | INTRAVENOUS | Status: AC
  Administered 2014-01-11: 1000 mL via INTRAVENOUS

## 2014-01-11 MED ORDER — ACETAMINOPHEN 650 MG RE SUPP
650.0000 mg | Freq: Four times a day (QID) | RECTAL | Status: DC | PRN
  Administered 2014-01-12 – 2014-01-15 (×5): 650 mg via RECTAL
  Filled 2014-01-11 (×6): qty 1

## 2014-01-11 NOTE — Progress Notes (Signed)
Attending Addendum  I examined the patient and discussed the assessment and plan with Dr. Waynetta SandyWight. I have reviewed the note and agree.  Spoke to her daughter-in-law Eunice BlaseDebbie at bedside today and updated her regarding plan. Also updated regarding plan to address future episodes of hypernatremia. Palliative care consult.   Reviewed CXR obtained for crackles on lung exam. No pulmonary edema. Plan to give NS bolus x one now to replace volume.   Hypernatremia: correcting slowly. Agree with D5W.       Dessa PhiFUNCHES,Taunya Goral, MD FAMILY MEDICINE TEACHING SERVICE

## 2014-01-11 NOTE — Progress Notes (Signed)
Dr. Piedad ClimesHonig made aware of temp elevation 103 rectally. New orders received.

## 2014-01-11 NOTE — Progress Notes (Signed)
Thank you for consulting the Palliative Medicine Team at [facility] to meet your patient's and family's needs.   The reason that you asked us to see your patient is for GOC and options.  We have scheduled your patient for a meeting: 2/19 11am  The Surrogate decision make is: Alfredo BachJesse Chagnon  Other family members that need to be present: sons  Your patient is able/unable to participate: unable  Additional Narrative: Spouse, Verdon CumminsJesse, has been Management consultantdecision maker and is currently hospitalized himself. Plan to meet with sons and with Verdon CumminsJesse if he is able to participate. May preliminary meet with sons. Gala RomneyDoug is going to check with his father to see if he is able to participate and wants to participate in full discussion.  Yong ChannelAlicia Lamberto Dinapoli, NP Palliative Medicine Team Pager # [phone] Team Phone # [phone]

## 2014-01-11 NOTE — Progress Notes (Signed)
SLP Cancellation Note  Patient Details Name: Vickie Hoffman MRN: [id number] DOB: 05/05/1920   Cancelled evaluation:       Reason Eval/Treat Not Completed: Patient's level of consciousness Order received for swallow eval: pt not sufficiently arousable for participation.  Palliative care meeting is scheduled for tomorrow.  Will await results of meeting to determine if our services remain warranted.    Thank you, Daniela Hernan L. Samson Fredericouture, [place] CCC/SLP Pager [phone]    Blenda MountsCouture, Halsey Persaud Laurice 01/11/2014, 4:44 PM

## 2014-01-11 NOTE — Progress Notes (Addendum)
Interim Progress Note S: Called by RN for increased shortness of breath.  Patient is minimal responsive, which appears to be her baseline.  She is currently on a venturi mask.  O: BP 183/77  Pulse 129  Temp(Src) 98.8 F (37.1 C) (Axillary)  Resp 32  Ht 5\' 4"  (1.626 m)  Wt 127 lb 10.3 oz (57.9 kg)  BMI 21.90 kg/m2  SpO2 95% Gen: lying in bed, responds to voice CV: tachycardic with 3/6 blowing systolic murmur Pulm: increased rate, increased work of breathing; breath sounds slightly coarse, but no crackles or wheezes Legs: no edema  A/P: 80 [place] with [place] admitted for HCAP and respiratory distress. - reviewed CXR from this morning which looks mildly worse than yesterday's - she does appear to be in some respiratory distress without signs of volume overload - will get repeat CXR - morphine 1mg  IV q4hr prn to help with shortness of breath - continue fluid resuscitation with 1/2NS for both volume and hypernatremia - will discuss with CCM for possible BiPap as patient is DNR; there is a palliative care meeting with family scheduled for tomorrow at 11am. --> She is not a candidate for BiPap with history of aspiration. - will call and discuss with family.  Vickie Hoffman 01/11/2014, 5:56 PM   01/11/2014, 6:30 PM Spoke with both husband and her son Gala RomneyDoug.  Informed them that her breathing is not doing very well and she may not recover from this pneumonia.  Let them know of the possibility of death during the night.  They would like to continue to treat what we can, but focus more on keeping her comfortable.  Both husband and son confirmed that she is a DNR/DNI.  Vickie Hoffman

## 2014-01-11 NOTE — Progress Notes (Signed)
Pt.assessed with lungs sounding wet, and increasing O2 demand due to O2 stats in the 80s. Called MD. Fluids stopped, venturi mask applied. Will continue to monitor.

## 2014-01-11 NOTE — H&P (Signed)
Attending Addendum  I examined the patient and discussed the assessment and plan with Dr. Aviva SignsPiloto. I have reviewed the note and agree.  Briefly, 80 yo F NH patient [facility] by [facility] on 01/02/14 admitted for dehydration and AMS. Patient is DNR. Patient was exhibiting signs of dehydration 2/2 poor po intake and dementia on 01/02/14.   BP 142/64  Pulse 119  Temp(Src) 99.9 F (37.7 C) (Axillary)  Resp 23  Ht 5\' 4"  (1.626 m)  Wt 127 lb 10.3 oz (57.9 kg)  BMI 21.90 kg/m2  SpO2 97% General appearance: lying in bed, no distress, moans to voice, not opening eyes spontaneously. Eyes: PERRL, R eye with chronic lower lid irritation and discharge. Normal conjunctiva b.l. Lungs: clear to auscultation bilaterally Heart: tachy, Regular rate  Abdomen: soft, non-tender; bowel sounds normal; no masses,  no organomegaly Extremities: extremities normal, atraumatic, no cyanosis or edema  A/P: 80 yo F meeting SIRS criteria with infiltrates on CXR and severe hypernatremia,  1, Sepsis: vanc and zosyn. Admit to stepdown. IV fluids.   2. Chronic hyponatremia: slow correction and frequent sodium checks.     Dessa PhiFUNCHES,Trevon Strothers, MD FAMILY MEDICINE TEACHING SERVICE

## 2014-01-11 NOTE — Progress Notes (Signed)
Patient ID: Vickie Hoffman, female   DOB: May 13, 1920, 80 y.o.   MRN: [id number]   Nursing Home Visit: I went to see Vickie Hoffman at [facility] today. Unfortunately, she was admitted to the hospital yesterday for HCAP and respiratory distress. I was unable to see/examine her today. I have reviewed her hospitalization and I will make an effort to visit her socially while she is at [facility]. Based on how the remainder of her hospitalization goes, I will plan to evaluate her if/when she is able to return to [facility].  I appreciate the excellent continuity care of the Geriatric team while Vickie Hoffman was at the nursing home, as well as the more acute care [facility] is providing now that she is in the hospital.  Ricki MillerAmber M. Deovion Batrez, M.D. 01/11/2014 8:32 PM

## 2014-01-11 NOTE — Progress Notes (Signed)
Family Medicine Teaching Service Daily Progress Note Intern Pager: [phone]  Patient name: Vickie Hoffman Medical record number: [id number] Date of birth: [birth year] Age: 80 y.o. Gender: female  Primary Care Provider: Kevin Fenton, MD Consultants: none Code Status: DNR/DNI  Pt Overview and Major Events to Date:  2/17 - vanc/cefepime  Assessment and Plan: Vickie Hoffman is a 80 y.o. female presenting from [facility] found to be unresponsive (baseline alert / talking), initially in ED was tachycardic, tachypneic, febrile, found to have LLL HCAP / UTI. Clinically consistent with sepsis. PMH is significant for dementia (wt loss with dec PO), anxiety/depression, hiatal hernia, COPD, HTN, h/o CVA, h/o tachy-brady syndrome.   # Severe Sepsis / HCAP / UTI, without hypotension - with altered mental status  Meets criteria SIRS with leukocytosis (18.9), tachycardia (119), borderline tachypnea and borderline fever; suspected source LLL pneumonia on CXR (considered HCAP, nursing home resident) vs UTI (UA with large leuks, TNTC WBC, pos nitrite). Labs significant for WBC 18.9, inc neutrophil (82%). Evidence of tissue hypoperfusion / end-organ damage with elevated LA 2.25, Cr 2.42, Trop 0.14. Currently no O2 requirement.   - remains with altered mental status / decreased responsiveness  - vancomycin + cefepime (received 1x zosyn in ED) - BCx x 2 - Urine culture with >100k e. Coli, pending sensitivities - consult to palliative care, discussed with son Gilford Rile) agreeable, and expresses patient's previous wishes of DNR/DNI, question of if this situation (including hypernatremia) will continue, do not transfer? - still tachycardic, BNP this morning 2941, will hold off on repeating NS bolus. - Repeat CXR this morning with persistent left pleural effusion and left basilar atelectasis vs infiltrate  # Hypernatremia, likely secondary to dehydration  sodium on admission 168, chloride 122. Chart  review shows this is a new finding likely due to severe dehydration in the setting of AMS (also note that WBC/RBC/Plts are all elevated above baseline, consistent with dehydration). On admission Free water deficit = 5.13 L. Received 1L in ED of NS and started on 1/2NS yesterday, however appeared fluid overloaded overnight with desat and fluids stopped. Sodium this AM 162, fluid deficit 4.1 L - start D5W @ 83cc/hr = 0.54mEq/hr sodium correction - repeat Bmet q6hrs while giving fluids (next draw at 16:00 today)  # AKI, likely secondary to dehydration with hypoperfusion  Likely 2/2 to hypoperfusion mentioned above  - Cr on admission 2.42, baseline appears 1-1.1 range  - trending down to 1.92 this AM - hydrate per above   # Troponin elevation, likely secondary to demand ischemia in setting of sepsis  Elevated i-STAT troponin 0.14. EKG with Sinus tachycardia, Probable left atrial enlargement, LVH with secondary repolarization abnormality, Inferior infarct (old).  - serum troponins negative x 3  - continue to monitor  # Acute Encephalopathy with decreased responsiveness, in setting of chronic dementia  Acute decline in mental status with decreased responsiveness likely secondary to sepsis / hypovolemia / dehydration, hypernatremia. Expect improvement on treatment of infectious / metabolic insults. Pt inc risk for AMS changes given h/o dementia  - continue to monitor  - when mental status improve will consider restart PO Aricept.   # Depression  - will hold for now and consider restart home effexor, wellbutrin when able to take PO.   FEN/GI:  - NPO (d/t AMS) at D5W @ 83 cc/hr  Prophylaxis: Heparin SQ  Disposition: SDU, pending palliative meeting/clinical improvement  Subjective:  Encephalopathic to exam, does not respond to voice/touch.   Per nurse, husband appears  to also be admitted to hospital currently.  Objective: Temp:  [98 F (36.7 C)-100.2 F (37.9 C)] 98 F (36.7 C) (02/18  0406) Pulse Rate:  [110-127] 115 (02/18 0406) Resp:  [13-26] 22 (02/18 0406) BP: (125-168)/(49-90) 161/56 mmHg (02/18 0406) SpO2:  [87 %-100 %] 94 % (02/18 0406) Weight:  [126 lb 1.7 oz (57.2 kg)-127 lb 10.3 oz (57.9 kg)] 127 lb 10.3 oz (57.9 kg) (02/17 1330) Physical Exam: General: NAD, minimally responsive/sleeping  HEENT: PERRL (right iris with ?coloboma, irregular shape). MM are dry Cardiovascular: Tachycardic, regular rhythm, 1/6 systolic murmur Respiratory: coarse breath sounds throughout, crackles at the bases, transmitted upper airway Abdomen: soft, NTND, bowel sounds present Extremities: trace edema bilaterally, WWP. 2+ PT pulses Neuro: spontaneous movement x 4 limbs, responsive to nurse changing oxygen mask but does not open eyes spontaneously.  Laboratory:  Recent Labs Lab 01/18/2014 1000 01/15/2014 1023 01/11/14 0112  WBC 18.9*  --  14.7*  HGB 14.9 16.3* 11.7*  HCT 48.2* 48.0* 38.7  PLT 415*  --  284    Recent Labs Lab 01/20/2014 1000 12/30/2013 1023 01/15/2014 1951 01/11/14 0112  NA 168* 166* 164* 163*  K 5.0 4.6 4.2 3.9  CL 122* 121* 126* 124*  CO2 23  --  23 25  BUN 72* 64* 62* 60*  CREATININE 2.42* 2.70* 2.04* 1.96*  CALCIUM 10.8*  --  9.0 9.1  PROT 8.0  --   --   --   BILITOT 0.5  --   --   --   ALKPHOS 255*  --   --   --   ALT 19  --   --   --   AST 19  --   --   --   GLUCOSE 110* 109* 113* 103*   Troponin negative x 3 BNP 2941  Urine cx: e. Coli >100k colonies, pending sensitivities Blood cx: pending  Imaging/Diagnostic Tests:  2/17 1v portable CXR IMPRESSION:  1. Interval left basilar atelectasis or pneumonia and small left  pleural effusion.  2. The previously seen hiatal hernia is not as well visualized  today.  3. Stable cardiomegaly and changes of COPD and chronic bronchitis.  2/18 1v portable CXR IMPRESSION:  Persistent small left pleural effusion with left basilar atelectasis  or infiltrate.  Tawni CarnesAndrew Mckay Brandt, MD 01/11/2014, 7:12  AM PGY-1, [facility] Family Medicine FPTS Intern pager: [phone], text pages welcome

## 2014-01-12 DIAGNOSIS — F0150 Vascular dementia without behavioral disturbance: Secondary | ICD-10-CM

## 2014-01-12 DIAGNOSIS — Z515 Encounter for palliative care: Secondary | ICD-10-CM

## 2014-01-12 DIAGNOSIS — Z66 Do not resuscitate: Secondary | ICD-10-CM

## 2014-01-12 DIAGNOSIS — J189 Pneumonia, unspecified organism: Secondary | ICD-10-CM

## 2014-01-12 LAB — BASIC METABOLIC PANEL
BUN: 45 mg/dL — ABNORMAL HIGH (ref 6–23)
CHLORIDE: 117 meq/L — AB (ref 96–112)
CO2: 22 meq/L (ref 19–32)
CREATININE: 1.6 mg/dL — AB (ref 0.50–1.10)
Calcium: 9 mg/dL (ref 8.4–10.5)
GFR calc non Af Amer: 27 mL/min — ABNORMAL LOW (ref >90)
GFR, EST AFRICAN AMERICAN: 31 mL/min — AB (ref >90)
Glucose, Bld: 86 mg/dL (ref 70–99)
POTASSIUM: 3.7 meq/L (ref 3.7–5.3)
Sodium: 154 mEq/L — ABNORMAL HIGH (ref 137–147)

## 2014-01-12 MED ORDER — ATROPINE SULFATE 1 % OP SOLN
4.0000 [drp] | OPHTHALMIC | Status: DC | PRN
  Administered 2014-01-13 – 2014-01-14 (×7): 4 [drp] via SUBLINGUAL
  Filled 2014-01-12: qty 2

## 2014-01-12 MED ORDER — SODIUM CHLORIDE 0.9 % IV BOLUS (SEPSIS)
1000.0000 mL | Freq: Once | INTRAVENOUS | Status: AC
  Administered 2014-01-12: 1000 mL via INTRAVENOUS

## 2014-01-12 MED ORDER — LORAZEPAM 2 MG/ML IJ SOLN
1.0000 mg | INTRAMUSCULAR | Status: DC | PRN
  Administered 2014-01-13: 1 mg via INTRAVENOUS
  Filled 2014-01-12: qty 1

## 2014-01-12 MED ORDER — ONDANSETRON HCL 4 MG/2ML IJ SOLN: 4.0000 mg | Freq: Four times a day (QID) | INTRAMUSCULAR | Status: DC | PRN

## 2014-01-12 MED ORDER — MORPHINE SULFATE 2 MG/ML IJ SOLN
1.0000 mg | INTRAMUSCULAR | Status: DC | PRN
  Administered 2014-01-13 – 2014-01-15 (×4): 1 mg via INTRAVENOUS
  Filled 2014-01-12 (×4): qty 1

## 2014-01-12 MED ORDER — BISACODYL 10 MG RE SUPP
10.0000 mg | Freq: Every day | RECTAL | Status: DC | PRN
  Administered 2014-01-13: 10 mg via RECTAL
  Filled 2014-01-12: qty 1

## 2014-01-12 NOTE — Progress Notes (Signed)
Attending Addendum  I examined the patient and discussed the assessment and plan with Dr. Waynetta SandyWight. I have reviewed the note and agree.  Patient with persistent AMS despite broad spectrum antibiotics, supplemental O2 and improving Na. Less responsive today compared to yesterday.  Crackles on lungs exam.   Agree with moving towards comfort measures with continuing Abx and weaning to narrow spectrum, continuing antibiotics weaning to [facility], morphine and xanax for comfort, O2 via [place] for comfort. No blood draws.   Patient's sons agreeable to plan.  Patient's husband must also agree as next of kin.  Appreciate palliative care recommendations.   Dessa PhiFUNCHES,Odel Schmid, MD FAMILY MEDICINE TEACHING SERVICE

## 2014-01-12 NOTE — Consult Note (Signed)
Patient GQ:[id number] H Luzier      DOB: September 22, 1920      DTO:[id number]     Consult Note from the Palliative Medicine Team at [place] Requested by: Dr. Lamar Benes     PCP: Kenn File, MD Reason for Consultation: [facility] and options.    Phone Number:[phone]  Assessment of patients Current state: Vickie Hoffman is 80 yo female who was found unresponsive at [facility], transported to ED and admitted with sepsis with HCAP and UTI. She has PMH of dementia, COPD, HTN, stroke, tachy-brady syndrome, anxiety/depression, and hiatal hernia. She has been receiving Zosyn and Vancomycin and fluid resuscitation for sepsis and hypernatremia (Na 154). She has continued to decline and yesterday temperature spiked to 103 F and currently requiring increased oxygen support via venti-mask 40%.   I met today with her sons, Marden Noble [phone]), and Doland [phone]). We discussed Vickie Hoffman and her decline and they are understanding that she is not progressing and that we believe she is close to death at this point. They tell me that they spoke with her husband, Denyse Amass, and that he was very tearful and unable to tolerate in depth discussion for goals of care as he is hospitalized and dealing with his own health. They both agree (and say they have spoken with their father) that focus should be on her comfort because they understand it is unlikely we can turn her around from this infection. They are interested in moving her out of ICU and into a non-monitored bed (6N) and focusing more on her comfort than her "numbers." We will maintain oxygen for comfort measures and utilize Morphine and Ativan as needed for comfort. They do wish to continue IV antibiotics for now.  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 Her family says that she has declined over the past 5 years and significantly since the past two years when she fractured her hip. She has been bed bound, less  responsive, and declining appetite according to the family. I will continue to follow and support Vickie Hoffman and her family.    Goals of Care: 1.  Code Status: DNR   2. Scope of Treatment: 1. Vital Signs: routine 2. Respiratory/Oxygen: for comfort 3. Nutritional Support/Tube Feeds: no 4. Antibiotics: continue for now 5. IVF: continue as indicated for now 6. Review of Medications to be discontinued: Minimized for comfort 7. Telemetry: no 8. Consults: palliative   4. Disposition: Anticipate hospital death.    3. Symptom Management:   1. Anxiety/Agitation: Ativan prn 2. Pain: Morphine prn. Please optimize for comfort and use with tachypnea, dyspnea, grimace, muscle tension, or any signs of pain. May consider low dose continuous infusion if needed. 3. Bowel Regimen: Dulcolax supp prn.  4. Fever: Acetaminophen supp prn.  5. Nausea/Vomiting: Ondansetron prn.   6. Terminal Secretions: Atropine SL prn.   4. Psychosocial: Emotional support provided to sons during difficult discussion.   Brief HPI: 80 yo female with sepsis.   ROS: Unable to elicit - unresponsive.     PMH:  Past Medical History  Diagnosis Date  . Dementia   . Anxiety and depression      PSH: Past Surgical History  Procedure Laterality Date  . Compression hip screw  10/03/2011    Procedure: COMPRESSION HIP;  Surgeon: Mcarthur Rossetti;  Location: [place];  Service: Orthopedics;  Laterality: Right;  . Fracture surgery     I have reviewed  the FH and SH and  If appropriate update it with new information. Allergies  Allergen Reactions  . Statins Other (See Comments)    Muscle cramps, pains, leg weakness  . Sulfa Antibiotics Other (See Comments)    Unknown    Scheduled Meds: . heparin  5,000 Units Subcutaneous 3 times per day  . piperacillin-tazobactam (ZOSYN)  IV  2.25 g Intravenous 3 times per day  . vancomycin  750 mg Intravenous Q48H   Continuous Infusions: . sodium chloride 125 mL  (01/12/14 0624)   PRN Meds:.acetaminophen, hydrALAZINE, morphine injection    BP 121/48  Pulse 116  Temp(Src) 100.7 F (38.2 C) (Rectal)  Resp 22  Ht _0  (1.626 m)  Wt 57.6 kg (126 lb 15.8 oz)  BMI 21.79 kg/m2  SpO2 94%   PPS: 10%   Intake/Output Summary (Last 24 hours) at 01/12/14 1033 Last data filed at 01/12/14 3295  Gross per 24 hour  Intake 2083.17 ml  Output      0 ml  Net 2083.17 ml   LBM: 01/12/14                         Physical Exam:  General: Slight resp distress HEENT: [place]/AT, mucous membranes dry Chest: Coarse throughout, bibasilar crackles CVS: Tachycardic, irreg Abdomen: Soft, ND, hypoactive BS Ext: BLE trace edema, mottling of feet Neuro: Minimally responsive (slight grimace to loud voice and touch), no purposeful movement  Labs: CBC    Component Value Date/Time   WBC 14.7* 01/11/2014 0112   RBC 3.99 01/11/2014 0112   RBC 3.95 10/03/2011 1532   HGB 11.7* 01/11/2014 0112   HCT 38.7 01/11/2014 0112   PLT 284 01/11/2014 0112   MCV 97.0 01/11/2014 0112   MCH 29.1 01/11/2014 0112   MCHC 30.0 01/11/2014 0112   RDW 16.2* 01/11/2014 0112   LYMPHSABS 2.1 01/15/2014 1000   MONOABS 1.3* 01/17/2014 1000   EOSABS 0.0 01/18/2014 1000   BASOSABS 0.1 01/13/2014 1000    BMET    Component Value Date/Time   NA 154* 01/12/2014 0744   NA 139 10/31/2013   K 3.7 01/12/2014 0744   CL 117* 01/12/2014 0744   CO2 22 01/12/2014 0744   GLUCOSE 86 01/12/2014 0744   BUN 45* 01/12/2014 0744   BUN 37* 10/31/2013   CREATININE 1.60* 01/12/2014 0744   CREATININE 1.3* 10/31/2013   CALCIUM 9.0 01/12/2014 0744   GFRNONAA 27* 01/12/2014 0744   GFRAA 31* 01/12/2014 0744    CMP     Component Value Date/Time   NA 154* 01/12/2014 0744   NA 139 10/31/2013   K 3.7 01/12/2014 0744   CL 117* 01/12/2014 0744   CO2 22 01/12/2014 0744   GLUCOSE 86 01/12/2014 0744   BUN 45* 01/12/2014 0744   BUN 37* 10/31/2013   CREATININE 1.60* 01/12/2014 0744   CREATININE 1.3* 10/31/2013   CALCIUM 9.0 01/12/2014  0744   PROT 8.0 01/13/2014 1000   ALBUMIN 3.4* 12/30/2013 1000   AST 19 01/09/2014 1000   ALT 19 01/01/2014 1000   ALKPHOS 255* 12/30/2013 1000   BILITOT 0.5 01/17/2014 1000   GFRNONAA 27* 01/12/2014 0744   GFRAA 31* 01/12/2014 0744     Time In Time Out Total Time Spent with Patient Total Overall Time  1045 1215 49mn 936m    Greater than 50%  of this time was spent counseling and coordinating care related to the above assessment and plan.  AlVinie Sill  NP Palliative Medicine Team Pager # [phone] Team Phone # [phone]

## 2014-01-12 NOTE — Progress Notes (Signed)
Family Medicine Teaching Service Daily Progress Note Intern Pager: [phone]  Patient name: Vickie Hoffman Medical record number: [id number] Date of birth: 12/14/1919 Age: 80 y.o. Gender: female  Primary Care Provider: Kevin FentonBradshaw, Samuel, MD Consultants: none Code Status: DNR/DNI  Pt Overview and Major Events to Date:  2/17 - vanc/cefepime 2/18 - vanc/zosyn, irregular HR 2/19 - move toward comfort care, continue abx/IVF  Assessment and Plan: Vickie Hoffman is a 80 y.o. female [facility] found to be unresponsive (baseline alert / talking), initially in ED was tachycardic, tachypneic, febrile, found to have LLL HCAP / UTI. Clinically consistent with sepsis. PMH is significant for dementia (wt loss with dec PO), anxiety/depression, hiatal hernia, COPD, HTN, h/o CVA, h/o tachy-brady syndrome.   # Severe Sepsis / HCAP / UTI, without hypotension - with altered mental status  Meets criteria SIRS with leukocytosis (18.9), tachycardia (119), borderline tachypnea and borderline fever; suspected source LLL pneumonia on CXR (considered HCAP, nursing home resident) vs UTI (UA with large leuks, TNTC WBC, pos nitrite). Labs significant for WBC 18.9, inc neutrophil (82%). Evidence of tissue hypoperfusion / end-organ damage with elevated LA 2.25, Cr 2.42, Trop 0.14.  Clinically worsened yesterday with increased O2 req, fever spike. Cefepime switched to zosyn - BCx x 2 NGTD x 48hrs - Urine culture with >100k e. Coli, pending sensitivities - venturi mask 40% FiO2 at 10L, attempt to wean to [place] for comfort - appreciate palliative care consult, meeting at 11am this morning, planning to continue IVF and antibiotics but will move toward de-escalation of care and comfort. Transfer out of SDU to 6N and turn off monitors and stop blood draws. Continue morphine 1-2mg  IV q4hrs PRN for shortness of breath - continue vancomycin + zosyn  # SVT, resolved: had brief episode of SVT this morning with HR up  to 180s, back down to 110-120s. Ordered EKG which shows continued sinus tachy with irregular rate due to likely premature SVCs/PACs. 1 addition 1L NS bolus, however after this will likely continue to decrease IVF. In context of de-escalation of care will turn off monitors - take off cardiac monitoring   # Hypernatremia, likely secondary to dehydration  sodium on admission 168, chloride 122. Chart review shows this is a new finding likely due to severe dehydration in the setting of AMS (also note that WBC/RBC/Plts are all elevated above baseline, consistent with dehydration). On admission Free water deficit = 5.13 L. Received 1L in ED of NS and started on 1/2NS, stopped due to concern for fluid overload, restarted D5W and also received additional 1L NS bolus yesterday. Sodium this AM 154, fluid deficit 2.61 L. - on 1/2NS 125cc/hr - stop bmet draws  # AKI, likely secondary to dehydration with hypoperfusion  Likely 2/2 to hypoperfusion mentioned above  - Cr on admission 2.42, baseline appears 1-1.1 range, trending down to 1.60 - hydrate per above   # Troponin elevation, likely secondary to demand ischemia in setting of sepsis  Elevated i-STAT troponin 0.14. EKG with Sinus tachycardia, Probable left atrial enlargement, LVH with secondary repolarization abnormality, Inferior infarct (old).  - serum troponins negative x 3  - continue to monitor - repeat EKG overnight with now premature supraventricular complexes   # Acute Encephalopathy with decreased responsiveness, in setting of chronic dementia  Acute decline in mental status with decreased responsiveness likely secondary to sepsis / hypovolemia / dehydration, hypernatremia. Expect improvement on treatment of infectious / metabolic insults. Pt inc risk for AMS changes given h/o dementia  -  continue to monitor  - if mental status improves will consider restart PO Aricept.   # Depression  - will hold for now and consider restart home effexor,  wellbutrin when able to take PO.   FEN/GI:  - NPO (d/t AMS) at 1/2NS at 125cc/hr, will decrease this throughout the day Prophylaxis: Heparin SQ  Disposition: pending palliative meeting/clinical improvement  Subjective:  Had fever 103F overnight, cefepime switched to zosyn.  Still non-responsive this morning to touch/verbal, will respond somewhat to sternal rub.  Objective: Temp:  [98.8 F (37.1 C)-103 F (39.4 C)] 100.1 F (37.8 C) (02/19 0416) Pulse Rate:  [104-137] 104 (02/19 0600) Resp:  [15-37] 15 (02/19 0600) BP: (111-183)/(32-77) 111/38 mmHg (02/19 0600) SpO2:  [92 %-97 %] 92 % (02/19 0600) FiO2 (%):  [40 %] 40 % (02/19 0600) Weight:  [126 lb 15.8 oz (57.6 kg)] 126 lb 15.8 oz (57.6 kg) (02/19 0500) Physical Exam: General: NAD, minimally responsive/sleeping  HEENT: PERRL (right iris with ?coloboma, irregular shape). MM are dry Cardiovascular: tachycardic, irregular rhythm, 2/6 systolic murmur Respiratory: increased labored breathing. coarse breath sounds throughout, very mild crackles at bases L>R, transmitted upper airway sounds Abdomen: soft, NTND, bowel sounds present Extremities: trace edema bilaterally, WWP. 2+ PT pulses Neuro: spontaneous movement x 4 limbs, opens eyes to sternal rub  Laboratory:  Recent Labs Lab 01/18/2014 1000 01/07/2014 1023 01/11/14 0112  WBC 18.9*  --  14.7*  HGB 14.9 16.3* 11.7*  HCT 48.2* 48.0* 38.7  PLT 415*  --  284    Recent Labs Lab 01/12/2014 1000  01/11/14 0840 01/11/14 1653 01/11/14 2210  NA 168*  < > 162* 156* 157*  K 5.0  < > 3.9 3.7 3.7  CL 122*  < > 122* 118* 118*  CO2 23  < > 27 23 24   BUN 72*  < > 57* 50* 47*  CREATININE 2.42*  < > 1.92* 1.63* 1.60*  CALCIUM 10.8*  < > 9.6 9.3 9.2  PROT 8.0  --   --   --   --   BILITOT 0.5  --   --   --   --   ALKPHOS 255*  --   --   --   --   ALT 19  --   --   --   --   AST 19  --   --   --   --   GLUCOSE 110*  < > 90 131* 91  < > = values in this interval not  displayed. Troponin negative x 3 BNP 2941  Urine cx: e. Coli >100k colonies, pending sensitivities Blood cx: pending  Imaging/Diagnostic Tests:  2/17 1v portable CXR IMPRESSION:  1. Interval left basilar atelectasis or pneumonia and small left  pleural effusion.  2. The previously seen hiatal hernia is not as well visualized  today.  3. Stable cardiomegaly and changes of COPD and chronic bronchitis.  2/18 1v portable CXR IMPRESSION:  Persistent small left pleural effusion with left basilar atelectasis  or infiltrate.  2/18 1v portable CXR FINDINGS:  Progression of bilateral lower lobe infiltrates. There is a small  left pleural effusion. The heart size is mildly enlarged and stable.  No overt edema is identified.  IMPRESSION:  Progression of bilateral lower lobe pneumonia with associated small  left pleural effusion.  Tawni Carnes, MD 01/12/2014, 7:17 AM PGY-1, [facility] Family Medicine FPTS Intern pager: [phone], text pages welcome

## 2014-01-12 NOTE — Progress Notes (Signed)
Called by Darien RamusAna, pt's Primary Nurse to check the patient.  Family at bedside, cardiac rhythm is SVT  (187) but it did not last long.  Dr. Waynetta SandyWight MD was notified of this.  No order given.

## 2014-01-12 NOTE — Consult Note (Signed)
I have reviewed this case with our NP and agree with the Assessment and Plan as stated.  Imogine Carvell L. Tashe Purdon, MD MBA The Palliative Medicine Team at [place] Team Phone: [phone] Pager: [phone]   

## 2014-01-12 NOTE — Progress Notes (Addendum)
SLP Cancellation Note  Patient Details Name: Wynonia MustyValleva H Whitener MRN: [id number] DOB: Feb 04, 1920   Cancelled treatment:       Reason Eval/Treat Not Completed: Patient's level of consciousness. Orders d/c'd by MD.   Ferdinand LangoLeah Jusiah Aguayo MA, CCC-SLP [phone]    Whittney Steenson Meryl 01/12/2014, 12:09 PM

## 2014-01-13 DIAGNOSIS — N390 Urinary tract infection, site not specified: Secondary | ICD-10-CM

## 2014-01-13 LAB — URINE CULTURE: Colony Count: >=100000

## 2014-01-13 MED ORDER — LEVOFLOXACIN IN D5W 750 MG/150ML IV SOLN
750.0000 mg | INTRAVENOUS | Status: DC
  Administered 2014-01-13 – 2014-01-15 (×2): 750 mg via INTRAVENOUS
  Filled 2014-01-13 (×2): qty 150

## 2014-01-13 MED ORDER — DEXTROSE 5 % IV SOLN
1.0000 g | INTRAVENOUS | Status: DC
  Administered 2014-01-13 – 2014-01-15 (×3): 1 g via INTRAVENOUS
  Filled 2014-01-13 (×4): qty 10

## 2014-01-13 NOTE — Progress Notes (Signed)
ANTIBIOTIC CONSULT NOTE   Pharmacy Consult for Vancomycin, Zosyn Indication: aspiration pneumonia  Allergies  Allergen Reactions  . Statins Other (See Comments)    Muscle cramps, pains, leg weakness  . Sulfa Antibiotics Other (See Comments)    Unknown     Patient Measurements: Height: 5\' 4"  (162.6 cm) Weight: 126 lb 15.8 oz (57.6 kg) IBW/kg (Calculated) : 54.7  Labs:  Recent Labs  01/11/14 0112  01/11/14 1653 01/11/14 2210 01/12/14 0744  WBC 14.7*  --   --   --   --   HGB 11.7*  --   --   --   --   PLT 284  --   --   --   --   CREATININE 1.96*  < > 1.63* 1.60* 1.60*  < > = values in this interval not displayed. Estimated Creatinine Clearance: 19.8 ml/min (by C-G formula based on Cr of 1.6).  Microbiology: Recent Results (from the past 720 hour(s))  CULTURE, BLOOD (ROUTINE X 2)     Status: None   Collection Time    02-08-2014 10:00 AM      Result Value Ref Range Status   Specimen Description BLOOD RIGHT FOREARM   Final   Special Requests BOTTLES DRAWN AEROBIC AND ANAEROBIC 6CC   Final   Culture  Setup Time     Final   Value: 2014-02-08 14:01     Performed at [facility]   Culture     Final   Value:        BLOOD CULTURE RECEIVED NO GROWTH TO DATE CULTURE WILL BE HELD FOR 5 DAYS BEFORE ISSUING A FINAL NEGATIVE REPORT     Performed at [facility]   Report Status PENDING   Incomplete  URINE CULTURE     Status: None   Collection Time    02-08-2014 10:15 AM      Result Value Ref Range Status   Specimen Description URINE, CLEAN CATCH   Final   Special Requests NONE   Final   Culture  Setup Time     Final   Value: 02-08-14 11:22     Performed at [facility] Count     Final   Value: >=100,000 COLONIES/ML     Performed at [facility]   Culture     Final   Value: ESCHERICHIA COLI     Performed at [facility]   Report Status 01/13/2014 FINAL   Final   Organism ID, Bacteria ESCHERICHIA COLI   Final  CULTURE, BLOOD  (ROUTINE X 2)     Status: None   Collection Time    02-08-14 10:30 AM      Result Value Ref Range Status   Specimen Description BLOOD RIGHT HAND   Final   Special Requests BOTTLES DRAWN AEROBIC AND ANAEROBIC 4CC   Final   Culture  Setup Time     Final   Value: 08-Feb-2014 14:01     Performed at [facility]   Culture     Final   Value:        BLOOD CULTURE RECEIVED NO GROWTH TO DATE CULTURE WILL BE HELD FOR 5 DAYS BEFORE ISSUING A FINAL NEGATIVE REPORT     Performed at [facility]   Report Status PENDING   Incomplete  MRSA PCR SCREENING     Status: None   Collection Time    02-08-14  1:38 PM      Result  Value Ref Range Status   MRSA by PCR NEGATIVE  NEGATIVE Final   Comment:            The GeneXpert MRSA Assay (FDA     approved for NASAL specimens     only), is one component of a     comprehensive MRSA colonization     surveillance program. It is not     intended to diagnose MRSA     infection nor to guide or     monitor treatment for     MRSA infections.    Medical History: Past Medical History  Diagnosis Date  . Dementia   . Anxiety and depression    Assessment: 80 year old female beginning antibiotics for presumed aspiration pneumonia.   Goal of Therapy:  Vancomycin trough level 15-20 mcg/ml Appropriate Zosyn dosing  Plan:  1) Zosyn 2.25 grams iv Q 8 hours  2) Vancomycin 750 mg iv Q 48 hours 3) Follow up cultures, fever trend, Scr  Thank you. Talbert CageLora Ashyia Schraeder, PharmD [phone]  01/13/2014,10:25 AM

## 2014-01-13 NOTE — Progress Notes (Signed)
Progress Note from the Palliative Medicine Team at [facility]  Subjective: Vickie Hoffman appears more comfortable today but continues to spike fever over 100 F. We will continue to give Tylenol for fevers and can consider scheduled Tylenol if fevers continue for comfort. She was moaning and grunting during my assessment so nursing was asked to give her Morphine followed by Ativan if she was not relieved. Follow up with nursing says that she is resting comfortably. I have attempted to contact family throughout the day without success. Please call for any needs or symptom management over the weekend.    Objective: Allergies  Allergen Reactions  . Statins Other (See Comments)    Muscle cramps, pains, leg weakness  . Sulfa Antibiotics Other (See Comments)    Unknown    Scheduled Meds: . heparin  5,000 Units Subcutaneous 3 times per day  . piperacillin-tazobactam (ZOSYN)  IV  2.25 g Intravenous 3 times per day  . vancomycin  750 mg Intravenous Q48H   Continuous Infusions: . sodium chloride 20 mL/hr at 01/13/14 0953   PRN Meds:.acetaminophen, atropine, bisacodyl, hydrALAZINE, LORazepam, morphine injection, ondansetron (ZOFRAN) IV  BP 130/52  Pulse 114  Temp(Src) 98.3 F (36.8 C) (Axillary)  Resp 22  Ht 5\' 4"  (1.626 m)  Wt 57.6 kg (126 lb 15.8 oz)  BMI 21.79 kg/m2  SpO2 91%   PPS: 10%  Pain Score: non verbal    Intake/Output Summary (Last 24 hours) at 01/13/14 0959 Last data filed at 01/13/14 0818  Gross per 24 hour  Intake 2152.5 ml  Output      0 ml  Net 2152.5 ml      LBM: 01/12/14      Physical Exam:  General: NAD, minimally responsive HEENT: [place]/AT, dry mucous membranes, no JVD Chest: Coarse throughout, breathes not as labored as yesterday, RR 24 CVS: Irreg rhythm, S1 S2, HR 92 Abdomen: Soft, NT, ND, hypoactive BS Ext: BLE trace edema, warm to touch Neuro: Flickers eyes and moans to stimulation and loud voice, unable to follow commands  Labs: CBC    Component  Value Date/Time   WBC 14.7* 01/11/2014 0112   RBC 3.99 01/11/2014 0112   RBC 3.95 10/03/2011 1532   HGB 11.7* 01/11/2014 0112   HCT 38.7 01/11/2014 0112   PLT 284 01/11/2014 0112   MCV 97.0 01/11/2014 0112   MCH 29.1 01/11/2014 0112   MCHC 30.0 01/11/2014 0112   RDW 16.2* 01/11/2014 0112   LYMPHSABS 2.1 01/17/2014 1000   MONOABS 1.3* 12/28/2013 1000   EOSABS 0.0 01/08/2014 1000   BASOSABS 0.1 12/26/2013 1000    BMET    Component Value Date/Time   NA 154* 01/12/2014 0744   NA 139 10/31/2013   K 3.7 01/12/2014 0744   CL 117* 01/12/2014 0744   CO2 22 01/12/2014 0744   GLUCOSE 86 01/12/2014 0744   BUN 45* 01/12/2014 0744   BUN 37* 10/31/2013   CREATININE 1.60* 01/12/2014 0744   CREATININE 1.3* 10/31/2013   CALCIUM 9.0 01/12/2014 0744   GFRNONAA 27* 01/12/2014 0744   GFRAA 31* 01/12/2014 0744    CMP     Component Value Date/Time   NA 154* 01/12/2014 0744   NA 139 10/31/2013   K 3.7 01/12/2014 0744   CL 117* 01/12/2014 0744   CO2 22 01/12/2014 0744   GLUCOSE 86 01/12/2014 0744   BUN 45* 01/12/2014 0744   BUN 37* 10/31/2013   CREATININE 1.60* 01/12/2014 0744   CREATININE 1.3* 10/31/2013  CALCIUM 9.0 01/12/2014 0744   PROT 8.0 12/27/2013 1000   ALBUMIN 3.4* 01/13/2014 1000   AST 19 12/30/2013 1000   ALT 19 01/21/2014 1000   ALKPHOS 255* 01/08/2014 1000   BILITOT 0.5 01/12/2014 1000   GFRNONAA 27* 01/12/2014 0744   GFRAA 31* 01/12/2014 0744     Assessment and Plan: 1. Code Status: DNR 2. Symptom Control:  1. Anxiety/Agitation: Ativan prn  2. Pain: Morphine prn. Please optimize for comfort and use with tachypnea, dyspnea, grimace, muscle tension, or any signs of pain.  3. Bowel Regimen: Dulcolax supp prn.  4. Fever: Acetaminophen supp prn.  5. Nausea/Vomiting: Ondansetron prn.  6. Terminal Secretions: Atropine SL prn.  3. Psycho/Social: Attempted to reach family without success. Family is supportive of patient and comfort plan. 4. Disposition: To be determined on outcomes. Likely hospice facility.  Symptoms unstable for discharge today.   Time In Time Out Total Time Spent with Patient Total Overall Time  0900 0935 25min 35min    Greater than 50%  of this time was spent counseling and coordinating care related to the above assessment and plan.  Yong ChannelAlicia Eastyn Dattilo, NP Palliative Medicine Team Pager # [phone] Team Phone # [phone]    1

## 2014-01-13 NOTE — Progress Notes (Signed)
Family Medicine Teaching Service Daily Progress Note Intern Pager: [phone]  Patient name: Vickie Hoffman Medical record number: [id number] Date of birth: [birth year] Age: 80 y.o. Gender: female  Primary Care Provider: Kevin FentonBradshaw, Samuel, MD Consultants: none Code Status: DNR/DNI  Pt Overview and Major Events to Date:  2/17 - vanc/cefepime 2/18 - vanc/zosyn, irregular HR 2/19 - move toward comfort care, continue abx/IVF  Assessment and Plan: Vickie Hoffman is a 80 y.o. female presenting [facility] found to be unresponsive (baseline alert / talking), initially in ED was tachycardic, tachypneic, febrile, found to have LLL HCAP / UTI. Clinically consistent with sepsis. PMH is significant for dementia (wt loss with dec PO), anxiety/depression, hiatal hernia, COPD, HTN, h/o CVA, h/o tachy-brady syndrome.   # Severe Sepsis / HCAP / UTI, without hypotension - with altered mental status  Meets criteria SIRS with leukocytosis (18.9), tachycardia (119), borderline tachypnea and borderline fever; suspected source LLL pneumonia on CXR (considered HCAP, nursing home resident) vs UTI (UA with large leuks, TNTC WBC, pos nitrite). Labs significant for WBC 18.9, inc neutrophil (82%). Evidence of tissue hypoperfusion / end-organ damage with elevated LA 2.25, Cr 2.42, Trop 0.14.  Cefepime switched to zosyn 2/18. Fever 100.8 overnight, overall fever curve trending down. - BCx x 2 NGTD x 48hrs, pending today would be negative x 72hrs - Urine culture with >100k e. Coli, sensitive to ampicillin, cephalosporin, macrobid, zosyn (resistent cipro, levo, bactrim) - on [place] since last night, sats 91-96% - appreciate palliative care consult: planning to continue IVF and antibiotics but will move toward comfort care. Turn off monitors and stop blood draws. Continue morphine 1-2mg  IV q4hrs PRN for shortness of breath (received x1 yesterday) - continue vancomycin + zosyn  # Hypernatremia, likely secondary to  dehydration  sodium on admission 168, chloride 122. Chart review shows this is a new finding likely due to severe dehydration in the setting of AMS (also note that WBC/RBC/Plts are all elevated above baseline, consistent with dehydration). On admission Free water deficit = 5.13 L. Received 1L in ED of NS and started on 1/2NS, stopped due to concern for fluid overload, restarted D5W and also received additional 1L NS bolus yesterday. Sodium AM 2/19 was 154, fluid deficit 2.61 L at that time. Received additional 1L NS bolus yesterday and decreased fluids throughout the day. - received 1/2NS 50cc/hr overnight, KVO this morning - stop bmet draws  # AKI, likely secondary to dehydration with hypoperfusion  Likely 2/2 to hypoperfusion mentioned above  - Cr on admission 2.42, baseline appears 1-1.1 range, trending down to 1.60 - will not continue to trend this 2/2 no blood draws  # Troponin elevation, likely secondary to demand ischemia in setting of sepsis  Elevated i-STAT troponin 0.14. EKG with Sinus tachycardia, Probable left atrial enlargement, LVH with secondary repolarization abnormality, Inferior infarct (old).  - serum troponins negative x 3  - discontinued cardiac monitoring due to change in goals of care   # SVT, resolved: had brief episode of SVT this morning with HR up to 180s, back down to 110-120s. Ordered EKG which shows continued sinus tachy with irregular rate due to likely premature SVCs/PACs. 1 addition 1L NS bolus, however after this will likely continue to decrease IVF. In context of de-escalation of care will turn off monitors - take off cardiac monitoring   # Acute Encephalopathy with decreased responsiveness, in setting of chronic dementia  Acute decline in mental status with decreased responsiveness likely secondary to sepsis / hypovolemia /  dehydration, hypernatremia. Expect improvement on treatment of infectious / metabolic insults. Pt inc risk for AMS changes given h/o  dementia  - continue to monitor  - if mental status improves will consider restart PO Aricept.   # Depression  - will hold for now and consider restart home effexor, wellbutrin when able to take PO.   FEN/GI:  - NPO (d/t AMS), KVO fluids this morning Prophylaxis: Heparin SQ  Disposition: pending palliative meeting/clinical improvement  Subjective:  Slightly more responsive today, opens eyes briefly and moans in response to voice.   Objective: Temp:  [98.3 F (36.8 C)-101.6 F (38.7 C)] 98.3 F (36.8 C) (02/20 0502) Pulse Rate:  [75-122] 114 (02/20 0502) Resp:  [21-24] 22 (02/20 0502) BP: (114-151)/(39-59) 130/52 mmHg (02/20 0502) SpO2:  [91 %-99 %] 91 % (02/20 0502) FiO2 (%):  [40 %] 40 % (02/19 1206) Physical Exam: General: NAD, minimally responsive/sleeping  HEENT: PERRL (right iris with ?coloboma, irregular shape). MM are dry Cardiovascular: tachycardic, irregular rhythm, 2/6 systolic murmur Respiratory: work of breathing better today. coarse breath sounds throughout, transmitted upper airway sounds Abdomen: soft, NTND, bowel sounds present Extremities: trace edema bilaterally, WWP. 2+ PT pulses Neuro: spontaneous movements, opens eyes to voice, will slightly squeeze hand on command  Laboratory:  Recent Labs Lab 01/11/2014 1000 01/19/2014 1023 01/11/14 0112  WBC 18.9*  --  14.7*  HGB 14.9 16.3* 11.7*  HCT 48.2* 48.0* 38.7  PLT 415*  --  284    Recent Labs Lab 12/30/2013 1000  01/11/14 1653 01/11/14 2210 01/12/14 0744  NA 168*  < > 156* 157* 154*  K 5.0  < > 3.7 3.7 3.7  CL 122*  < > 118* 118* 117*  CO2 23  < > 23 24 22   BUN 72*  < > 50* 47* 45*  CREATININE 2.42*  < > 1.63* 1.60* 1.60*  CALCIUM 10.8*  < > 9.3 9.2 9.0  PROT 8.0  --   --   --   --   BILITOT 0.5  --   --   --   --   ALKPHOS 255*  --   --   --   --   ALT 19  --   --   --   --   AST 19  --   --   --   --   GLUCOSE 110*  < > 131* 91 86  < > = values in this interval not displayed. Troponin  negative x 3 BNP 2941  Urine cx: e. Coli >100k colonies, pending sensitivities Blood cx: pending  Imaging/Diagnostic Tests:  2/17 1v portable CXR IMPRESSION:  1. Interval left basilar atelectasis or pneumonia and small left  pleural effusion.  2. The previously seen hiatal hernia is not as well visualized  today.  3. Stable cardiomegaly and changes of COPD and chronic bronchitis.  2/18 1v portable CXR IMPRESSION:  Persistent small left pleural effusion with left basilar atelectasis  or infiltrate.  2/18 1v portable CXR FINDINGS:  Progression of bilateral lower lobe infiltrates. There is a small  left pleural effusion. The heart size is mildly enlarged and stable.  No overt edema is identified.  IMPRESSION:  Progression of bilateral lower lobe pneumonia with associated small  left pleural effusion.  Tawni Carnes, MD 01/13/2014, 7:34 AM PGY-1, [facility] Family Medicine FPTS Intern pager: [phone], text pages welcome

## 2014-01-13 NOTE — Progress Notes (Signed)
Clinical Social Work Department BRIEF PSYCHOSOCIAL ASSESSMENT 01/13/2014  Patient:  Vickie Hoffman,Vickie Hoffman     Account Number:  [id number]     Admit date:  07/27/14  Clinical Social Worker:  Varney BilesANDERSON,Adelfa Lozito, LCSWA  Date/Time:  01/13/2014 03:33 PM  Referred by:  Physician  Date Referred:  01/13/2014 Referred for  Residential hospice placement  SNF Placement   Other Referral:   Interview type:  Other - See comment Other interview type:   Palliative NP  Internal medicine teaching service MD    PSYCHOSOCIAL DATA Living Status:  FACILITY Admitted from facility:  [facility] Level of care:  Skilled Nursing Facility Primary support name:  Vickie Hoffman [phone]([id number]) Primary support relationship to patient:  SPOUSE Degree of support available:   Good--[facility]    CURRENT CONCERNS Current Concerns  Post-Acute Placement   Other Concerns:    SOCIAL WORK ASSESSMENT / PLAN CSW received consult "Goal would be to disposition patient to either [facility] or to a hospice facility within the next 1-3 days." CSW read palliative note stating anticipated hospital death, and CSW called palliatve team and spoke with NP and NP recommended holding off on referrals to [facility]/residential hospice because it is anticipated pt will die in the hospital. CSW called teaching service and spoke with MD about this plan and MD agreed. CSW to leave handoff for weekend CSW about case and weekend CSW can follow up if plans for discharge change.   Assessment/plan status:  Psychosocial Support/Ongoing Assessment of Needs Other assessment/ plan:   Information/referral to community resources:   [facility]? Residential hospice?    PATIENT'S/FAMILY'S RESPONSE TO PLAN OF CARE: N/A       Vickie LabradorJulie Felica Hoffman, MSW, [facility] Clinical Social Worker [phone]

## 2014-01-13 NOTE — Progress Notes (Signed)
Nutrition Brief Note  Patient identified on the Malnutrition Screening Tool (MST) Report  Wt Readings from Last 15 Encounters:  01/12/14 126 lb 15.8 oz (57.6 kg)  12/30/13 126 lb 3.2 oz (57.244 kg)  10/26/13 131 lb 9.6 oz (59.693 kg)  08/16/13 138 lb 3.2 oz (62.687 kg)  06/16/13 134 lb 12.8 oz (61.145 kg)  05/08/13 151 lb 10.8 oz (68.8 kg)  06/23/12 136 lb 11 oz (62 kg)  10/03/11 137 lb 11.2 oz (62.46 kg)  10/03/11 137 lb 11.2 oz (62.46 kg)    Body mass index is 21.79 kg/(m^2). Patient meets criteria for Normal Weight based on current BMI. Weight history shows 9% wt loss in the past 5 months.   Current diet order is NPO. Per chart, family has decided to focus on comfort care, no nutrition support. Labs and medications reviewed.   No nutrition interventions warranted at this time. If nutrition issues arise, please consult RD.   Ian Malkineanne Barnett RD, LDN Inpatient Clinical Dietitian Pager: [phone] After Hours Pager: [phone]

## 2014-01-13 NOTE — Progress Notes (Signed)
Attending Addendum  I examined the patient and discussed the assessment and plan with Dr. Waynetta SandyWight. I have reviewed the note and agree.  Briefly, 80 yo F admitted with hypernatremia, HCAP, UTI and AMS currently with comfort care. [facility] D/c tele and blood sticks. Will continue to treat PNA with levaquin and UTI with CTX.   Appreciate palliative care recommendations. Goal would be to disposition patient to [facility] or to a hospice facility within the next 1-3 days.     Dessa PhiFUNCHES,Calli Bashor, MD FAMILY MEDICINE TEACHING SERVICE

## 2014-01-14 DIAGNOSIS — F341 Dysthymic disorder: Secondary | ICD-10-CM

## 2014-01-14 NOTE — Progress Notes (Signed)
Family Medicine Teaching Service Daily Progress Note Intern Pager: [phone]  Patient name: Vickie Hoffman Medical record number: [id number] Date of birth: 15-Jul-1920 Age: 80 y.o. Gender: female  Primary Care Provider: Kevin Fenton, MD Consultants: none Code Status: DNR/DNI  Pt Overview and Major Events to Date:  2/17 - vanc/cefepime 2/18 - vanc/zosyn, irregular HR 2/19 - move toward comfort care, continue abx/IVF 2/20 - Made comfort care and to stay in hospital until demise as likely not to survive transport  Assessment and Plan: Vickie Hoffman is a 80 y.o. female presenting from [facility] found to be unresponsive (baseline alert / talking), initially in ED was tachycardic, tachypneic, febrile, found to have LLL HCAP / UTI. Clinically consistent with sepsis. PMH is significant for dementia (wt loss with dec PO), anxiety/depression, hiatal hernia, COPD, HTN, h/o CVA, h/o tachy-brady syndrome.   # Severe Sepsis / HCAP / UTI, without hypotension - with altered mental status  Meets criteria SIRS with leukocytosis (18.9), tachycardia (119), borderline tachypnea and borderline fever; suspected source LLL pneumonia on CXR (considered HCAP, nursing home resident) vs UTI (UA with large leuks, TNTC WBC, pos nitrite). Labs significant for WBC 18.9, inc neutrophil (82%). Evidence of tissue hypoperfusion / end-organ damage with elevated LA 2.25, Cr 2.42, Trop 0.14.  Cefepime switched to zosyn 2/18. Fever 100.8 overnight, overall fever curve trending down. - BCx x 2 NGTD  x 72hrs- Final - Urine culture with >100k e. Coli, sensitive to ampicillin, cephalosporin, macrobid, zosyn (resistent cipro, levo, bactrim) - on [place] since last night, sats 90-94% on 6L - appreciate palliative care consult: planning to continue IVF and antibiotics but will move toward comfort care. Turn off monitors and stop blood draws. Continue morphine 1-2mg  IV q4hrs PRN for shortness of breath (received x1 yesterday) -  continue vancomycin + zosyn  # Hypernatremia, likely secondary to dehydration  sodium on admission 168, chloride 122. Chart review shows this is a new finding likely due to severe dehydration in the setting of AMS (also note that WBC/RBC/Plts are all elevated above baseline, consistent with dehydration). On admission Free water deficit = 5.13 L. Received 1L in ED of NS and started on 1/2NS, stopped due to concern for fluid overload, restarted D5W and also received additional 1L NS bolus yesterday. Sodium AM 2/19 was 154, fluid deficit 2.61 L at that time. No longer checking due to pt being made comfort measures - stop bmet draws  # AKI, likely secondary to dehydration with hypoperfusion  Likely 2/2 to hypoperfusion mentioned above Cr on admission 2.42, baseline appears 1-1.1 range, trending down to 1.60. Pt comfort measures - will not continue to trend this 2/2 no blood draws  # Troponin elevation, likely secondary to demand ischemia in setting of sepsis  Elevated i-STAT troponin 0.14. EKG with Sinus tachycardia, Probable left atrial enlargement, LVH with secondary repolarization abnormality, Inferior infarct (old).  - serum troponins negative x 3  - discontinued cardiac monitoring due to change in goals of care   # SVT, resolved: had brief episode of SVT this morning with HR up to 180s, back down to 110-120s. Ordered EKG which shows continued sinus tachy with irregular rate due to likely premature SVCs/PACs. 1 addition 1L NS bolus, however after this will likely continue to decrease IVF. In context of de-escalation of care will turn off monitors - take off cardiac monitoring   # Acute Encephalopathy with decreased responsiveness, in setting of chronic dementia  Acute decline in mental status with decreased responsiveness likely  secondary to sepsis / hypovolemia / dehydration, hypernatremia. Expect improvement on treatment of infectious / metabolic insults. Pt inc risk for AMS changes given h/o  dementia  - continue to monitor  - if mental status improves will consider restart PO Aricept.   # Depression  - will hold for now and consider restart home effexor, wellbutrin when able to take PO.   FEN/GI:  - NPO (d/t AMS), KVO fluids Prophylaxis: Heparin SQ  Disposition: pending palliative meeting/clinical improvement  Subjective:  Pt is lying in bed and non-responsive.   Objective: Temp:  [97.9 F (36.6 C)-98.2 F (36.8 C)] 98.2 F (36.8 C) (02/21 0547) Pulse Rate:  [122] 122 (02/21 0547) Resp:  [24-26] 24 (02/21 0547) BP: (130-148)/(60-67) 148/67 mmHg (02/21 0547) SpO2:  [90 %-94 %] 94 % (02/21 0547) Physical Exam: General: NAD, no response to Sternal Rub HEENT: . MM are dry Cardiovascular: tachycardic, irregular rhythm, 2/6 systolic murmur Respiratory: increased WOB. Coarse breath sounds throughout w/ limited breath sounds on L.  [place]: soft, NTND, bowel sounds present Extremities: trace edema bilaterally, WWP. 2+ PT pulses Neuro:unresponsive to noxious stimuli  Laboratory:  Recent Labs Lab 2014-07-22 1000 2014-07-22 1023 01/11/14 0112  WBC 18.9*  --  14.7*  HGB 14.9 16.3* 11.7*  HCT 48.2* 48.0* 38.7  PLT 415*  --  284    Recent Labs Lab 2014-07-22 1000  01/11/14 1653 01/11/14 2210 01/12/14 0744  NA 168*  < > 156* 157* 154*  K 5.0  < > 3.7 3.7 3.7  CL 122*  < > 118* 118* 117*  CO2 23  < > 23 24 22   BUN 72*  < > 50* 47* 45*  CREATININE 2.42*  < > 1.63* 1.60* 1.60*  CALCIUM 10.8*  < > 9.3 9.2 9.0  PROT 8.0  --   --   --   --   BILITOT 0.5  --   --   --   --   ALKPHOS 255*  --   --   --   --   ALT 19  --   --   --   --   AST 19  --   --   --   --   GLUCOSE 110*  < > 131* 91 86  < > = values in this interval not displayed. Troponin negative x 3 BNP 2941  Urine cx: e. Coli >100k colonies, pending sensitivities Blood cx: pending  Imaging/Diagnostic Tests:  2/17 1v portable CXR IMPRESSION:  1. Interval left basilar atelectasis or pneumonia and  small left  pleural effusion.  2. The previously seen hiatal hernia is not as well visualized  today.  3. Stable cardiomegaly and changes of COPD and chronic bronchitis.  2/18 1v portable CXR IMPRESSION:  Persistent small left pleural effusion with left basilar atelectasis  or infiltrate.  2/18 1v portable CXR FINDINGS:  Progression of bilateral lower lobe infiltrates. There is a small  left pleural effusion. The heart size is mildly enlarged and stable.  No overt edema is identified.  IMPRESSION:  Progression of bilateral lower lobe pneumonia with associated small  left pleural effusion.  Ozella Rocksavid J Merrell, MD 01/14/2014, 9:00 AM PGY-3, [facility] Family Medicine FPTS Intern pager: [phone], text pages welcome

## 2014-01-14 NOTE — Progress Notes (Addendum)
Attending Addendum  I examined the patient and discussed the assessment and plan with Dr. Konrad DoloresMerrell. I have reviewed the note and agree.  Patient is found alone in room. No distress. Irregular heart rhythm    Anticipating hospital death for now. Will pursue placement if patient does not pass within over the weekend.   Continuing IV antibiotics for treatment of PNA.   Dessa PhiFUNCHES,Jaelee Laughter, MD FAMILY MEDICINE TEACHING SERVICE

## 2014-01-14 NOTE — Progress Notes (Signed)
I have reviewed this case with our NP and agree with the Assessment and Plan as stated.  Tyianna Menefee L. Aundrey Elahi, MD MBA The Palliative Medicine Team at [place] Team Phone: [phone] Pager: [phone]   

## 2014-01-14 NOTE — Progress Notes (Signed)
Patient NW:[id number]:Vickie Hoffman      DOB: 10/05/1920      YQM:[id number]   [facility]all[facility] Medicine [facility] at [facility] Progress Note    Subjective: Vickie Hoffman is not responsive to tactile or verbal stimuli.  There is no family in the room.  She appears in no distress. Filed Vitals:   01/14/14 0547  BP: 148/67  Pulse: 122  Temp: 98.2 F (36.8 C)  Resp: 24   Physical exam:  General: no acute distress Pupils not examined, mm dry Chest decreased with some basilar rhonchi CVS: tachycardic, S1, S2 Abd: soft no grimace on palpation Ext: still warm no mottling, trace to 1 edema bilaterally Neuro: not responsive to tactile or verbal stimuli  Assessment and plan: 80 yr old with pneumonia who is near death.  [facility]  I spoke with her son Gala RomneyDoug this am .  He would like to continue antibiotics so that they would know that they have done all that they can do .  He is ok with stopping heparin.  1.  DNR  2.  Anxiety treat with prn ativan  3.  Pain or dyspnea: continue prn morphine  5.  Stop heparin  6.  Continue antibiotics.  Total time 20 min:  [id number]  Emilyn Ruble L. Ladona Ridgelaylor, MD MBA The Palliative Medicine Team at [facility] Team Phone: [phone] Pager: [phone]

## 2014-01-15 DIAGNOSIS — R0609 Other forms of dyspnea: Secondary | ICD-10-CM

## 2014-01-15 DIAGNOSIS — R0989 Other specified symptoms and signs involving the circulatory and respiratory systems: Secondary | ICD-10-CM

## 2014-01-15 MED ORDER — MORPHINE SULFATE 2 MG/ML IJ SOLN
1.0000 mg | INTRAMUSCULAR | Status: DC
  Administered 2014-01-15 – 2014-01-16 (×8): 1 mg via INTRAVENOUS
  Filled 2014-01-15 (×10): qty 1

## 2014-01-15 MED ORDER — MORPHINE SULFATE 2 MG/ML IJ SOLN
1.0000 mg | INTRAMUSCULAR | Status: DC | PRN
  Administered 2014-01-15: 1 mg via INTRAVENOUS

## 2014-01-15 NOTE — Progress Notes (Signed)
Attending Addendum  I examined the patient and discussed the assessment and plan with Dr. Konrad DoloresMerrell. I have reviewed the note and agree.  Patient w/o distress. Fever. Mottling on legs.  Anticipate hospital death.     Dessa PhiFUNCHES,Vickie Altemose, MD FAMILY MEDICINE TEACHING SERVICE

## 2014-01-15 NOTE — Progress Notes (Signed)
Family Medicine Teaching Service Daily Progress Note Intern Pager: [phone]  Patient name: Vickie Hoffman Medical record number: [id number] Date of birth: 23-Aug-1920 Age: 80 y.o. Gender: female  Primary Care Provider: Kenn File, MD Consultants: none Code Status: DNR/DNI  Pt Overview and Major Events to Date:  2/17 - vanc/cefepime 2/18 - vanc/zosyn, irregular HR 2/19 - move toward comfort care, continue abx/IVF 2/20 - Made comfort care and to stay in hospital until demise as likely not to survive transport  Assessment and Plan: Vickie Hoffman is a 80 y.o. female presenting from [facility] found to be unresponsive (baseline alert / talking), initially in ED was tachycardic, tachypneic, febrile, found to have LLL HCAP / UTI. Clinically consistent with sepsis. PMH is significant for dementia (wt loss with dec PO), anxiety/depression, hiatal hernia, COPD, HTN, h/o CVA, h/o tachy-brady syndrome.   # Severe Sepsis / HCAP / UTI, without hypotension - with altered mental status  Met criteria SIRS on admission. Suspected source LLL pneumonia on CXR (considered HCAP, nursing home resident) vs UTI (UA with large leuks, TNTC WBC, pos nitrite). Evidence of tissue hypoperfusion / end-organ damage with elevated LA 2.25, Cr 2.42.  Now on CTX and Levoquin and still spiking fevers and tachy  - BCx x 2 NGTD  x 72hrs- Final - Urine culture with >100k e. Coli, sensitive to ampicillin, cephalosporin, macrobid, zosyn (resistent cipro, levo, bactrim) - Palliative care managing pt comfort measures at this time.  # Hypernatremia, likely secondary to dehydration  sodium on admission 168, chloride 122. Chart review shows this is a new finding likely due to severe dehydration in the setting of AMS (also note that WBC/RBC/Plts are all elevated above baseline, consistent with dehydration). No longer checking due to pt being made comfort measures - stop bmet draws  # AKI, likely secondary to dehydration  with hypoperfusion  Likely 2/2 to hypoperfusion mentioned above Cr on admission 2.42, baseline appears 1-1.1 range,  Pt comfort measures - will not continue to trend this 2/2 no blood draws  # Troponin elevation, likely secondary to demand ischemia in setting of sepsis  Elevated i-STAT troponin 0.14. EKG with Sinus tachycardia, Probable left atrial enlargement, LVH with secondary repolarization abnormality, Inferior infarct (old). serum troponins negative x 3  - discontinued cardiac monitoring due to change in goals of care  # Acute Encephalopathy with decreased responsiveness, in setting of chronic dementia  Acute decline in mental status with decreased responsiveness likely secondary to sepsis / hypovolemia / dehydration, hypernatremia. Expect improvement on treatment of infectious / metabolic insults. Pt inc risk for AMS changes given h/o dementia  - continue to monitor  - if mental status improves will consider restart PO Aricept.   # Depression  - will hold for now and consider restart home effexor, wellbutrin when able to take PO.   FEN/GI:  - NPO (d/t AMS), KVO fluids Prophylaxis: Heparin SQ  Disposition: Pending improvement vs demise from end organ failure Subjective:  Pt is lying in bed and non-responsive.   Objective: Temp:  [97.4 F (36.3 C)-102.9 F (39.4 C)] 102.9 F (39.4 C) (02/22 0619) Pulse Rate:  [122-148] 148 (02/22 0619) Resp:  [28-47] 47 (02/22 0619) BP: (151-156)/(63-84) 151/63 mmHg (02/22 0619) SpO2:  [90 %-94 %] 90 % (02/22 0619) Physical Exam: General: NAD, no response to Sternal Rub HEENT: . MM are dry Cardiovascular: tachycardic, irregular rhythm, 2/6 systolic murmur Respiratory: Agonal breathing  Adomen: soft, NTND, bowel sounds present Extremities: trace edema bilaterally, WWP. 2+ PT  pulses Neuro:unresponsive to noxious stimuli  Laboratory:  Recent Labs Lab 01/20/2014 1000 01/11/2014 1023 01/11/14 0112  WBC 18.9*  --  14.7*  HGB 14.9 16.3*  11.7*  HCT 48.2* 48.0* 38.7  PLT 415*  --  284    Recent Labs Lab 01/15/2014 1000  01/11/14 1653 01/11/14 2210 01/12/14 0744  NA 168*  < > 156* 157* 154*  K 5.0  < > 3.7 3.7 3.7  CL 122*  < > 118* 118* 117*  CO2 23  < > 23 24 22   BUN 72*  < > 50* 47* 45*  CREATININE 2.42*  < > 1.63* 1.60* 1.60*  CALCIUM 10.8*  < > 9.3 9.2 9.0  PROT 8.0  --   --   --   --   BILITOT 0.5  --   --   --   --   ALKPHOS 255*  --   --   --   --   ALT 19  --   --   --   --   AST 19  --   --   --   --   GLUCOSE 110*  < > 131* 91 86  < > = values in this interval not displayed. Troponin negative x 3 BNP 2941  Urine cx: e. Coli >100k colonies, pending sensitivities Blood cx: pending  Imaging/Diagnostic Tests:  2/17 1v portable CXR IMPRESSION:  1. Interval left basilar atelectasis or pneumonia and small left  pleural effusion.  2. The previously seen hiatal hernia is not as well visualized  today.  3. Stable cardiomegaly and changes of COPD and chronic bronchitis.  2/18 1v portable CXR IMPRESSION:  Persistent small left pleural effusion with left basilar atelectasis  or infiltrate.  2/18 1v portable CXR FINDINGS:  Progression of bilateral lower lobe infiltrates. There is a small  left pleural effusion. The heart size is mildly enlarged and stable.  No overt edema is identified.  IMPRESSION:  Progression of bilateral lower lobe pneumonia with associated small  left pleural effusion.  Waldemar Dickens, MD 01/15/2014, 8:11 AM PGY-3, [place] Intern pager: [phone], text pages welcome

## 2014-01-15 NOTE — Progress Notes (Addendum)
Patient AO:[id number]:Vickie Hoffman      DOB: 19-Apr-1920      QIO:[id number]   [facility]    Subjective: Patient with increased respiratory distress this am.  Persistent high fever over night.  Patient appears to have progressed over night and is closer to her time of death. Left message for son to call me to update. Patient only given 2 mg morphine in the last 24 hrs  Filed Vitals:   01/15/14 0619  BP: 151/63  Pulse: 148  Temp: 102.9 F (39.4 C)  Resp: 47   Physical exam:  General:  Acute distress with rapid respirations.   PERRL, EOMI, anicteric, mouth dry Chest :  Decreased with poor air entry CVS: tachycardic, S,1, S2 Abd: soft, not tender Ext: pink warm, no mottling Neuro: completely unresponsive to tactile and verbal stimuli    Assessment and plan: 80 yr old white female with pneumonia.  Family elected continued antibiotics but otherwise focus on comfort.  She is in distress this am. With rapid, ragged breathing.  1.  DNR  2.  Dyspnea/tachypnea:  Will schedule morphine 1 mg q 2hrs and use 1 mg prn q 1 hours. I do not believe that she will survive to require a full morphine drip but would have low threshold to do so  If she remains uncomfortable.   3. Terminal secretions controlled.   Total time:  800 am to 820 am  Discussed with primary team. Dr. Rickard PatiencePiloto  Brendin Situ L. Ladona Ridgelaylor, MD MBA The Palliative Medicine Team at [facility] Team Phone: [phone] Pager: [phone]

## 2014-01-15 NOTE — Discharge Summary (Addendum)
[facility] Death Summary  Patient name: Vickie Hoffman Medical record number: [id number] Date of birth: 1920/06/12 Age: 80 y.o. Gender: female Date of Admission: 01/26/14  Date of Death: 02/01/14 Admitting Physician: Minerva Ends, MD  Primary Care Provider: Kenn File, MD Consultants: Palliative Care  Indication for Hospitalization: Unresponsive Cause of Death: Respiratory failure secondary to Pneumonia  Problem List:  Severe sepsis due to HCAP, UTI Dementia COPD HTN CVA Tachy-brady syndrome  Brief Hospital Course:  Vickie Hoffman is a 80 y.o. female that was admitted from [facility] after she was found to be unresponsive. On presentation met sepsis criteria (leukocytosis, tachycardia) with pneumonia and UTI as sources. Initial labwork was significant for severe hypernatremia of 168, chloride 122, WBC of 18.9, and evidence of end organ damage with lactic acid 2.25, creatinine 2.42, troponin 0.14. She initially had no oxygen requirement and was started on IVF and IV antibiotics, vancomycin and cefepime for HCAP coverage. Her respiratory status worsened overnight and required oxygen by venturi mask. In the morning her tachycardia persisted and she had an episode of SVT that lasted for several minutes. She continued to be unresponsive and a palliative care meeting was held to discuss goals of care with the family. It was decided to transition toward comfort care, to continue IVF and IV antibiotics but discontinue all other interventions including telemetry and blood draws. She was transferred on out of the stepdown unit and continued supportive care with antibiotics and minimal IVF over the next 4 days. She was able to tolerate weaning oxygen to  per request of the family, however continued to be unresponsive during hospital course; patient was comatose for last 2 days of hospital course and non-responsive to sternal rub. She died around  6:30am the morning of 2014/02/01.  Significant Procedures: none  Significant Labs and Imaging:   Recent Labs Lab 2014-01-26 1000 January 26, 2014 1023 01/11/14 0112  WBC 18.9*  --  14.7*  HGB 14.9 16.3* 11.7*  HCT 48.2* 48.0* 38.7  PLT 415*  --  284    Recent Labs Lab Jan 26, 2014 1000  01/11/14 0112 01/11/14 0840 01/11/14 1653 01/11/14 2210 01/12/14 0744  NA 168*  < > 163* 162* 156* 157* 154*  K 5.0  < > 3.9 3.9 3.7 3.7 3.7  CL 122*  < > 124* 122* 118* 118* 117*  CO2 23  < > _0 GLUCOSE 110*  < > 103* 90 131* 91 86  BUN 72*  < > 60* 57* 50* 47* 45*  CREATININE 2.42*  < > 1.96* 1.92* 1.63* 1.60* 1.60*  CALCIUM 10.8*  < > 9.1 9.6 9.3 9.2 9.0  ALKPHOS 255*  --   --   --   --   --   --   AST 19  --   --   --   --   --   --   ALT 19  --   --   --   --   --   --   ALBUMIN 3.4*  --   --   --   --   --   --   < > = values in this interval not displayed.    Tawanna Sat, MD 01/15/2014, 8:37 PM PGY-1, [place]

## 2014-01-16 LAB — CULTURE, BLOOD (ROUTINE X 2)
Culture: NO GROWTH
Culture: NO GROWTH

## 2014-01-17 NOTE — Discharge Summary (Signed)
Attending Addendum   I have reviewed the note and agree with Dr. Laban EmperorWight's death summary.     Dessa PhiFUNCHES,Jd Mccaster, MD FAMILY MEDICINE TEACHING SERVICE

## 2014-01-22 NOTE — Progress Notes (Signed)
Pt. breathless/pulseless per K.Young,RN and S. Yerania Chamorro,RN..MD/family notified

## 2014-01-22 DEATH — deceased

## 2014-01-24 NOTE — Discharge Summary (Signed)
Attending Addendum  I dicussed the death summary with Dr. Waynetta SandyWight. I have reviewed the note and agree.    Dessa PhiFUNCHES,Zaneta Lightcap, MD FAMILY MEDICINE TEACHING SERVICE

## 2014-02-06 ENCOUNTER — Telehealth: Payer: Self-pay | Admitting: Family Medicine

## 2014-02-06 NOTE — Telephone Encounter (Signed)
Attempted to call Vickie Hoffman. Unable to leave VM.   I was informed that he received the condolence card for a different patient who we also cared for my the family medicine team, including myself, and had recently passed away.  Attempted to call to apologize and inform him that I mixed up the cards in the envelope and sent the wrong one to his family and sent his late wife's card to the other family. I am so very sorry for the mistake.
